# Patient Record
Sex: Female | Born: 1970 | Race: White | Hispanic: No | Marital: Married | Smoking: Never smoker
Health system: Southern US, Community
[De-identification: ages and names within clinical notes are randomized; demographics above are authoritative.]

## PROBLEM LIST (undated history)

## (undated) DIAGNOSIS — K219 Gastro-esophageal reflux disease without esophagitis: Secondary | ICD-10-CM

## (undated) DIAGNOSIS — F329 Major depressive disorder, single episode, unspecified: Secondary | ICD-10-CM

## (undated) DIAGNOSIS — F32A Depression, unspecified: Secondary | ICD-10-CM

## (undated) HISTORY — PX: OTHER SURGICAL HISTORY: SHX169

## (undated) HISTORY — PX: TONSILLECTOMY: SUR1361

## (undated) HISTORY — DX: Major depressive disorder, single episode, unspecified: F32.9

## (undated) HISTORY — DX: Depression, unspecified: F32.A

## (undated) HISTORY — DX: Gastro-esophageal reflux disease without esophagitis: K21.9

---

## 2004-06-11 ENCOUNTER — Emergency Department (HOSPITAL_COMMUNITY): Admission: EM | Admit: 2004-06-11 | Discharge: 2004-06-11 | Payer: Self-pay | Admitting: Emergency Medicine

## 2005-03-20 ENCOUNTER — Other Ambulatory Visit: Admission: RE | Admit: 2005-03-20 | Discharge: 2005-03-20 | Payer: Self-pay | Admitting: Obstetrics and Gynecology

## 2005-04-06 ENCOUNTER — Ambulatory Visit (HOSPITAL_COMMUNITY): Admission: RE | Admit: 2005-04-06 | Discharge: 2005-04-06 | Payer: Self-pay | Admitting: Obstetrics and Gynecology

## 2006-04-28 ENCOUNTER — Ambulatory Visit: Payer: Self-pay | Admitting: Family Medicine

## 2006-06-29 ENCOUNTER — Ambulatory Visit: Payer: Self-pay | Admitting: Family Medicine

## 2007-03-31 ENCOUNTER — Encounter: Admission: RE | Admit: 2007-03-31 | Discharge: 2007-03-31 | Payer: Self-pay | Admitting: Orthopaedic Surgery

## 2007-05-16 ENCOUNTER — Encounter: Payer: Self-pay | Admitting: Family Medicine

## 2008-04-21 ENCOUNTER — Emergency Department (HOSPITAL_COMMUNITY): Admission: EM | Admit: 2008-04-21 | Discharge: 2008-04-22 | Payer: Self-pay | Admitting: Emergency Medicine

## 2008-06-13 ENCOUNTER — Encounter (INDEPENDENT_AMBULATORY_CARE_PROVIDER_SITE_OTHER): Payer: Self-pay | Admitting: Obstetrics and Gynecology

## 2008-06-13 ENCOUNTER — Ambulatory Visit (HOSPITAL_COMMUNITY): Admission: RE | Admit: 2008-06-13 | Discharge: 2008-06-13 | Payer: Self-pay | Admitting: Obstetrics and Gynecology

## 2009-11-01 ENCOUNTER — Ambulatory Visit (HOSPITAL_COMMUNITY): Admission: RE | Admit: 2009-11-01 | Discharge: 2009-11-01 | Payer: Self-pay | Admitting: Obstetrics & Gynecology

## 2009-11-01 IMAGING — US US OB DETAIL+14 WK
2 of 3 series · 14 of 28 positions shown · non-contrast
Comparison: none

OBSTETRICAL ULTRASOUND:
 This ultrasound exam was performed in the [HOSPITAL] Ultrasound Department.  The OB US report was generated in the AS system, and faxed to the ordering physician.  This report is also available in [HOSPITAL]?s AccessANYware and in [REDACTED] PACS.

[Series 1: us ob detail add'left gest + 14 wk · 0.17mm/px · 2 of 29 slices shown (1 of 2)]
[im 1/29]
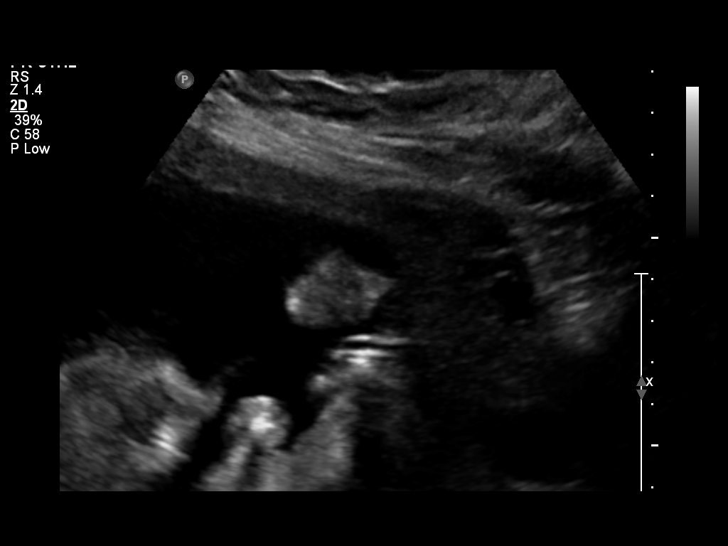
[im 19/29]
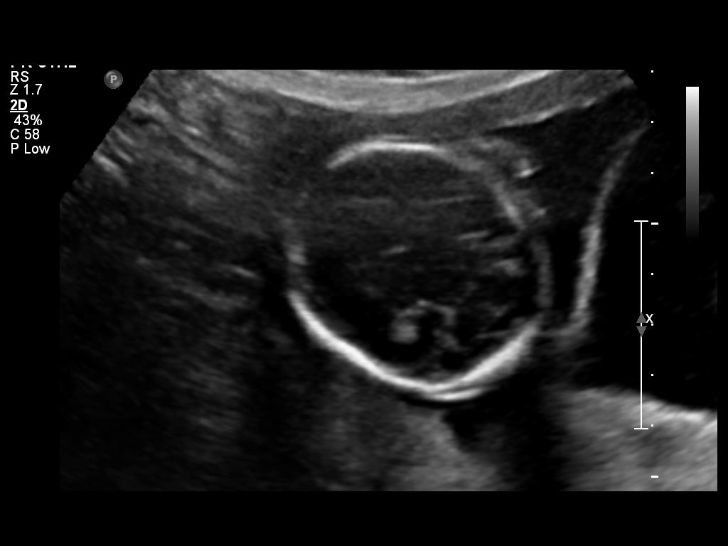

[Series 1: us ob detail add'left gest + 14 wk · 0.22mm/px · 12 of 152 slices shown (2 of 2)]
[im 1/152]
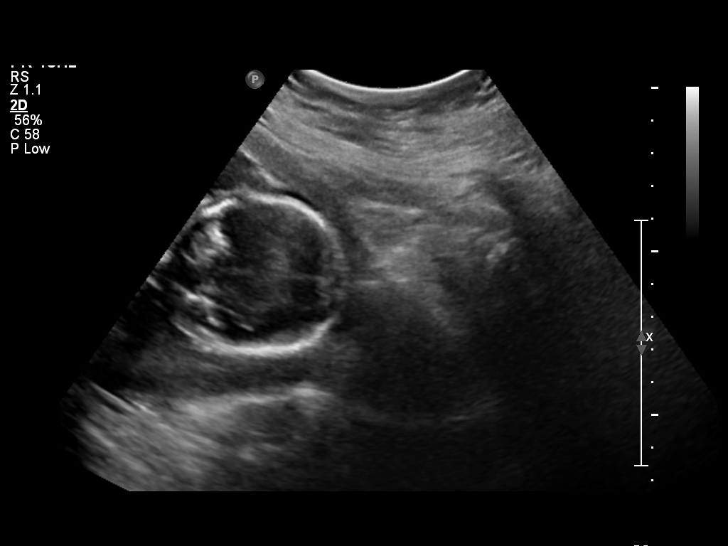
[im 14/152]
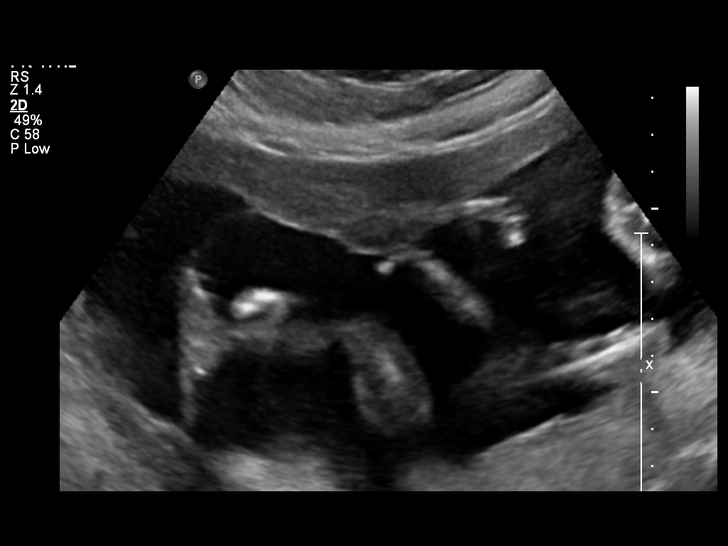
[im 28/152]
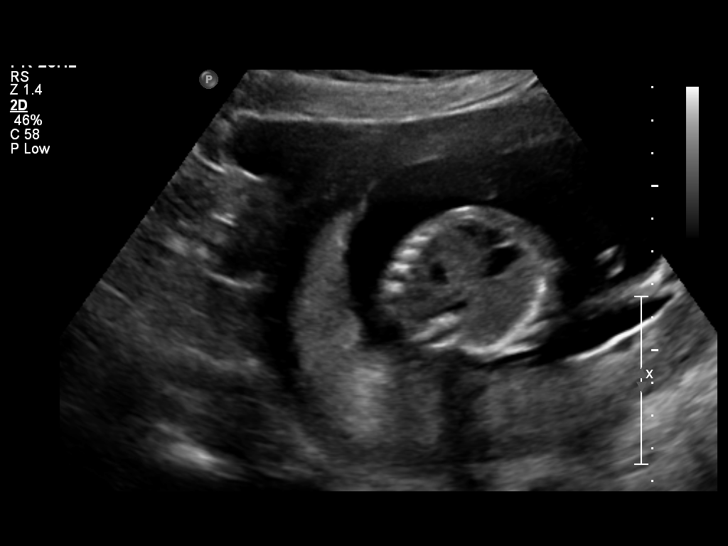
[im 42/152]
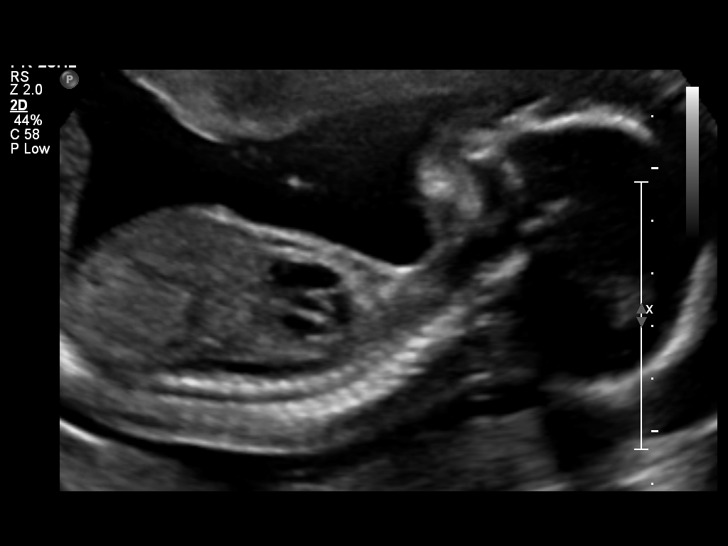
[im 55/152]
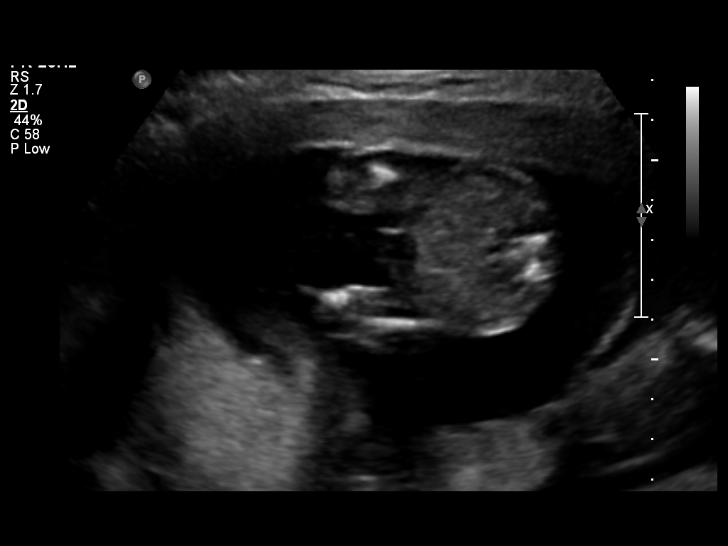
[im 69/152]
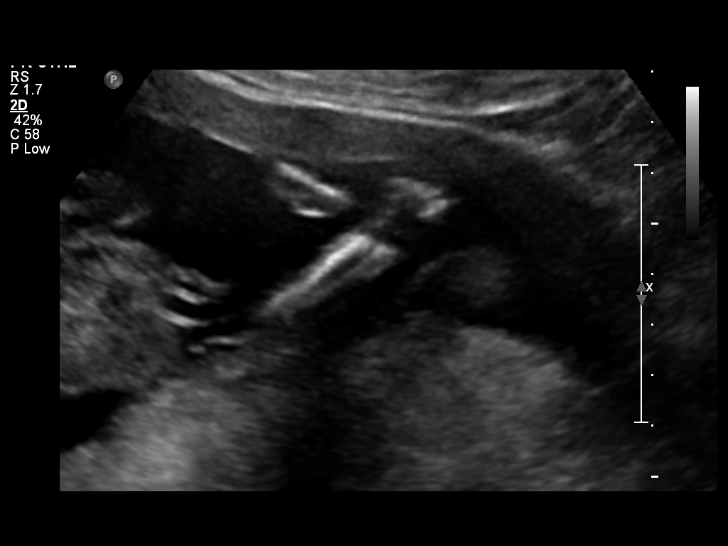
[im 83/152]
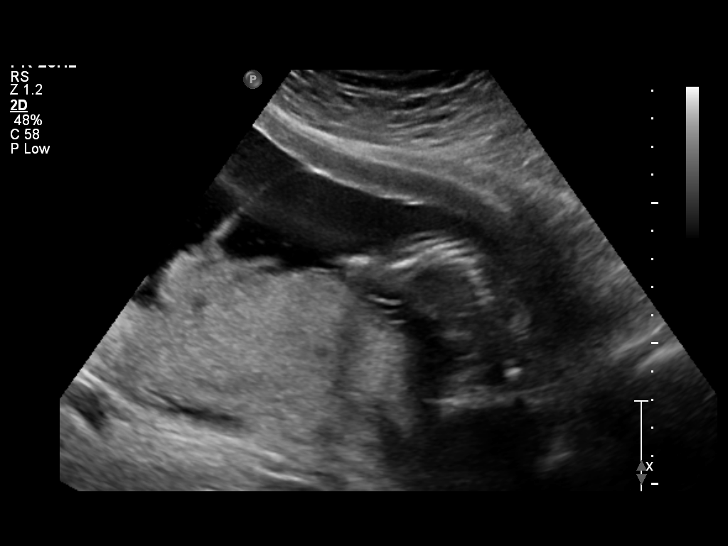
[im 97/152]
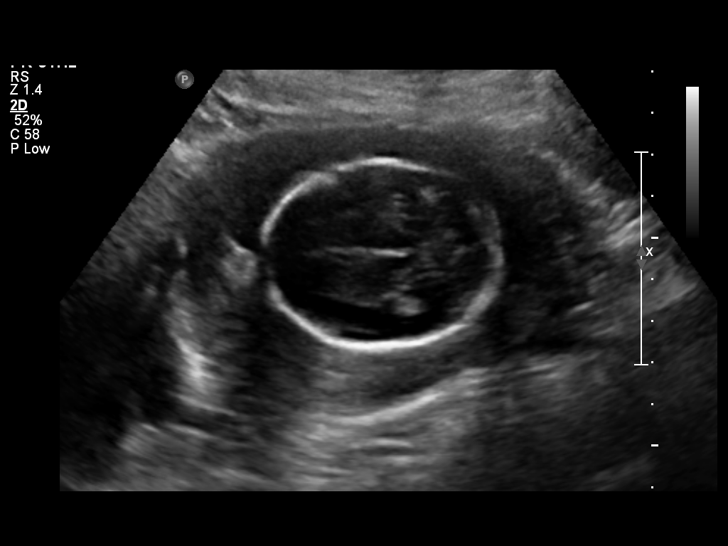
[im 110/152]
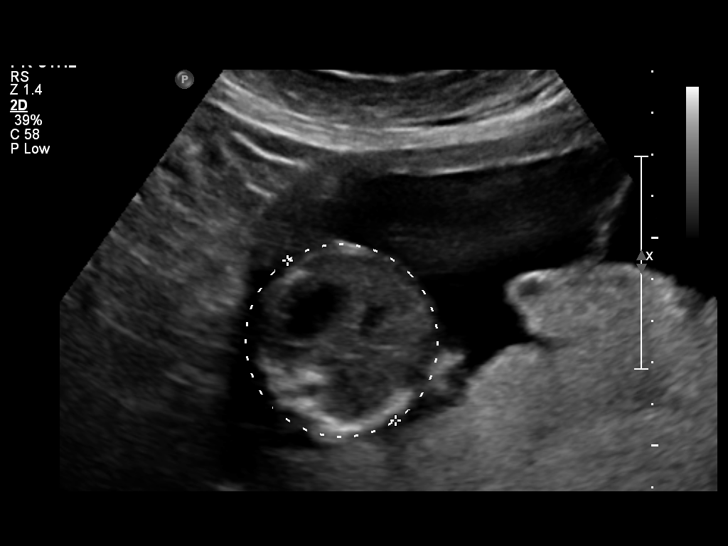
[im 124/152]
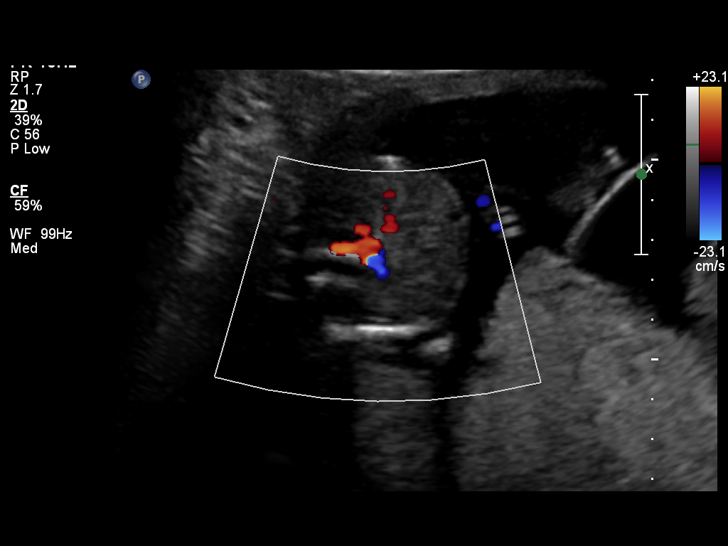
[im 138/152]
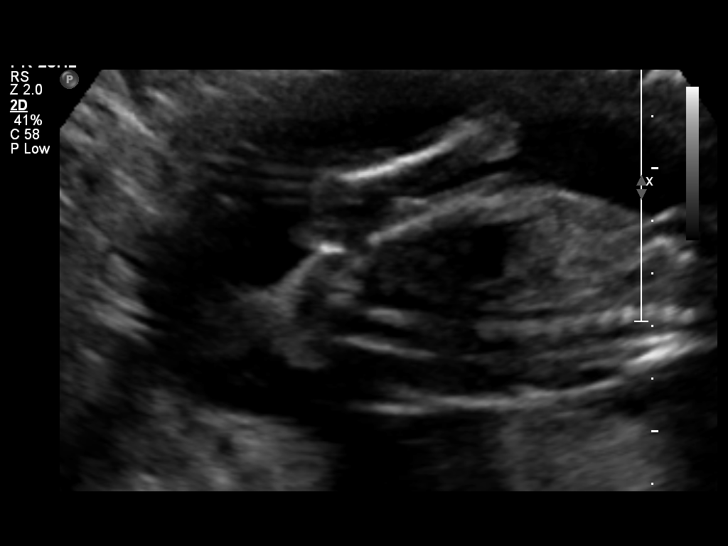
[im 152/152]
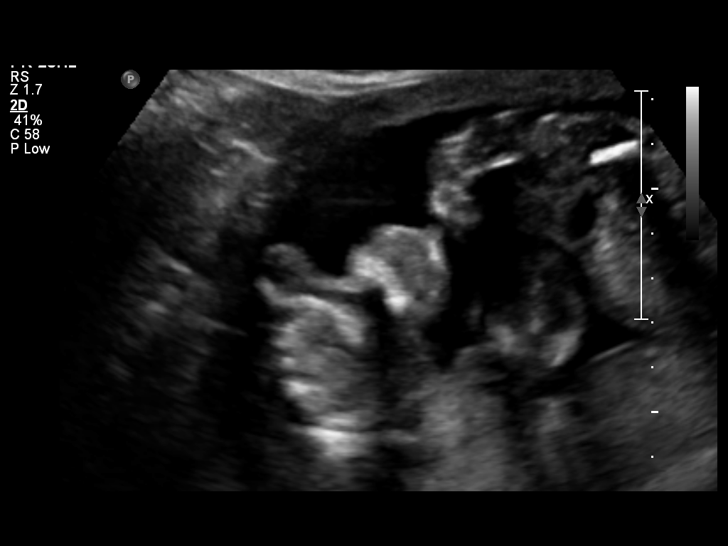

[14 of 28 positions shown; findings below may reference images not displayed]

IMPRESSION: See AS Obstetric US report.

## 2010-01-13 ENCOUNTER — Ambulatory Visit (HOSPITAL_COMMUNITY): Admission: RE | Admit: 2010-01-13 | Discharge: 2010-01-13 | Payer: Self-pay | Admitting: Obstetrics and Gynecology

## 2010-01-15 ENCOUNTER — Inpatient Hospital Stay (HOSPITAL_COMMUNITY): Admission: AD | Admit: 2010-01-15 | Discharge: 2010-01-18 | Payer: Self-pay | Admitting: Obstetrics and Gynecology

## 2010-01-21 ENCOUNTER — Inpatient Hospital Stay (HOSPITAL_COMMUNITY): Admission: AD | Admit: 2010-01-21 | Discharge: 2010-01-23 | Payer: Self-pay | Admitting: Obstetrics and Gynecology

## 2010-01-22 ENCOUNTER — Encounter (INDEPENDENT_AMBULATORY_CARE_PROVIDER_SITE_OTHER): Payer: Self-pay | Admitting: Obstetrics and Gynecology

## 2010-02-04 ENCOUNTER — Inpatient Hospital Stay (HOSPITAL_COMMUNITY): Admission: AD | Admit: 2010-02-04 | Discharge: 2010-02-04 | Payer: Self-pay | Admitting: Obstetrics and Gynecology

## 2010-02-04 ENCOUNTER — Ambulatory Visit: Payer: Self-pay | Admitting: Obstetrics and Gynecology

## 2010-02-06 ENCOUNTER — Inpatient Hospital Stay (HOSPITAL_COMMUNITY): Admission: AD | Admit: 2010-02-06 | Discharge: 2010-02-07 | Payer: Self-pay | Admitting: Obstetrics and Gynecology

## 2010-02-15 ENCOUNTER — Ambulatory Visit: Payer: Self-pay | Admitting: Family

## 2010-02-15 ENCOUNTER — Inpatient Hospital Stay (HOSPITAL_COMMUNITY): Admission: AD | Admit: 2010-02-15 | Discharge: 2010-02-15 | Payer: Self-pay | Admitting: Obstetrics and Gynecology

## 2010-03-02 ENCOUNTER — Encounter (INDEPENDENT_AMBULATORY_CARE_PROVIDER_SITE_OTHER): Payer: Self-pay | Admitting: Obstetrics and Gynecology

## 2010-03-02 ENCOUNTER — Ambulatory Visit: Payer: Self-pay | Admitting: Obstetrics and Gynecology

## 2010-03-02 ENCOUNTER — Inpatient Hospital Stay (HOSPITAL_COMMUNITY): Admission: AD | Admit: 2010-03-02 | Discharge: 2010-03-04 | Payer: Self-pay | Admitting: Obstetrics and Gynecology

## 2010-08-05 ENCOUNTER — Ambulatory Visit: Payer: Self-pay | Admitting: Family Medicine

## 2010-08-05 DIAGNOSIS — F341 Dysthymic disorder: Secondary | ICD-10-CM

## 2010-08-05 DIAGNOSIS — F410 Panic disorder [episodic paroxysmal anxiety] without agoraphobia: Secondary | ICD-10-CM

## 2010-09-08 ENCOUNTER — Telehealth: Payer: Self-pay | Admitting: Family Medicine

## 2010-10-12 ENCOUNTER — Encounter: Payer: Self-pay | Admitting: Obstetrics & Gynecology

## 2010-10-20 ENCOUNTER — Telehealth (INDEPENDENT_AMBULATORY_CARE_PROVIDER_SITE_OTHER): Payer: Self-pay | Admitting: *Deleted

## 2010-10-21 NOTE — Assessment & Plan Note (Signed)
Summary: re-estab; talk about refills for anxiety, depression med from...   Vital Signs:  Patient profile:   40 year old female Height:      63 inches Weight:      210.4 pounds BMI:     37.41 Pulse rate:   88 / minute Pulse rhythm:   regular BP sitting:   112 / 70  (left arm) Cuff size:   regular  Vitals Entered By: Almeta Monas CMA Duncan Dull) (August 05, 2010 2:02 PM) CC: Re-est care--wants to discuss Depression and meds   History of Present Illness: Pt is here to re establish her and to discuss her meds for her depression and anxiety.    Pt had twins 5 months and had post partum depression that was difficult to control   Pt was seeing psych at [facility] but she left and pt was put with someone else and they didn't "click".    Pt has not had xanax since giving  birth to twins.  Her last appointment with them was September.   She is not breast feeding.      Preventive Screening-Counseling & Management  Alcohol-Tobacco     Alcohol drinks/day: 0     Smoking Status: never  Caffeine-Diet-Exercise     Does Patient Exercise: yes      Drug Use:  no.    Current Medications (verified): 1)  Sertraline Hcl 100 Mg Tabs (Sertraline Hcl) .... 2 By Mouth Once Daily 2)  Nortriptyline Hcl 25 Mg Caps (Nortriptyline Hcl) .Marland Kitchen.. 1 By Mouth Once Daily 3)  Alprazolam 1 Mg Tabs (Alprazolam) .Marland Kitchen.. 1 By Mouth Two Times A Day  Allergies (verified): 1)  ! Pcn  Past History:  Family History: Last updated: 08/05/2010 Family History Depression Sister--Bipolar  Social History: Last updated: 08/05/2010 Occupation:  [employer] Married Alcohol use-no Drug use-no  Risk Factors: Alcohol Use: 0 (08/05/2010) Exercise: yes (08/05/2010)  Risk Factors: Smoking Status: never (08/05/2010)  Family History: Reviewed history and no changes required. Family History Depression Sister--Bipolar  Social History: Reviewed history and no changes required. Occupation:  [employer] Married Alcohol use-no Drug use-no Does Patient Exercise:  yes Smoking Status:  never Occupation:  employed Drug Use:  no  Review of Systems      See HPI  Physical Exam  General:  Well-developed,well-nourished,in no acute distress; alert,appropriate and cooperative throughout examination Lungs:  Normal respiratory effort, chest expands symmetrically. Lungs are clear to auscultation, no crackles or wheezes. Heart:  normal rate and no murmur.   Psych:  Cognition and judgment appear intact. Alert and cooperative with normal attention span and concentration. No apparent delusions, illusions, hallucinations   Impression & Recommendations:  Problem # 1:  DEPRESSION/ANXIETY (ICD-300.4) con't zoloft and nortriptyline add xanax back  pt given names and number for psych  Problem # 2:  PANIC ATTACK (ICD-300.01)  Her updated medication list for this problem includes:    Sertraline Hcl 100 Mg Tabs (Sertraline hcl) .Marland Kitchen... 2 by mouth once daily    Nortriptyline Hcl 25 Mg Caps (Nortriptyline hcl) .Marland Kitchen... 1 by mouth once daily    Alprazolam 1 Mg Tabs (Alprazolam) .Marland Kitchen... 1 by mouth two times a day  Discussed medication use and relaxation techniques.   Complete Medication List: 1)  Sertraline Hcl 100 Mg Tabs (Sertraline hcl) .... 2 by mouth once daily 2)  Nortriptyline Hcl 25 Mg Caps (Nortriptyline hcl) .Marland Kitchen.. 1 by mouth once daily 3)  Alprazolam 1 Mg Tabs (Alprazolam) .Marland Kitchen.. 1 by  mouth two times a day  Patient Instructions: 1)  Please schedule a follow-up appointment in 1 month---if not able to get into psych----otherwise as needed  Prescriptions: NORTRIPTYLINE HCL 25 MG CAPS (NORTRIPTYLINE HCL) 1 by mouth once daily  #30 x 5   Entered and Authorized by:   Loreen Freud DO   Signed by:   Loreen Freud DO on 08/05/2010   Method used:   [facility]        [facility] [phone]* (retail)       [address]       [place], [place]  [address]       Ph:  [phone]       Fax: [phone]   RxID:   [id number] SERTRALINE HCL 100 MG TABS (SERTRALINE HCL) 2 by mouth once daily  #60 x 5   Entered and Authorized by:   Loreen Freud DO   Signed by:   Loreen Freud DO on 08/05/2010   Method used:   [facility]ically to        [facility] [phone]* (retail)       [address]       [place], [place]  [address]       Ph: [phone]       Fax: [phone]   RxID:   [id number] ALPRAZOLAM 1 MG TABS (ALPRAZOLAM) 1 by mouth two times a day  #60 x 0   Entered and Authorized by:   Loreen Freud DO   Signed by:   Loreen Freud DO on 08/05/2010   Method used:   Print then Give to Patient   RxID:   [id number]    Orders Added: 1)  New Patient Level III [20254]   Immunization History:  Tetanus/Td Immunization History:    Tetanus/Td:  historical (adacel) (06/29/2006)   Immunization History:  Tetanus/Td Immunization History:    Tetanus/Td:  historical (Adacel) (06/29/2006)

## 2010-10-22 ENCOUNTER — Emergency Department (HOSPITAL_BASED_OUTPATIENT_CLINIC_OR_DEPARTMENT_OTHER)
Admission: EM | Admit: 2010-10-22 | Discharge: 2010-10-22 | Disposition: A | Discharge status: Home / Self Care | Payer: Managed Care, Other (non HMO) | Attending: Emergency Medicine | Admitting: Emergency Medicine

## 2010-10-22 ENCOUNTER — Emergency Department (INDEPENDENT_AMBULATORY_CARE_PROVIDER_SITE_OTHER): Payer: Managed Care, Other (non HMO)

## 2010-10-22 DIAGNOSIS — F329 Major depressive disorder, single episode, unspecified: Secondary | ICD-10-CM | POA: Insufficient documentation

## 2010-10-22 DIAGNOSIS — M6580 Other synovitis and tenosynovitis, unspecified site: Secondary | ICD-10-CM | POA: Insufficient documentation

## 2010-10-22 DIAGNOSIS — M25529 Pain in unspecified elbow: Secondary | ICD-10-CM | POA: Insufficient documentation

## 2010-10-22 DIAGNOSIS — F3289 Other specified depressive episodes: Secondary | ICD-10-CM | POA: Insufficient documentation

## 2010-10-23 NOTE — Progress Notes (Signed)
Summary: Refill Request  Phone Note Refill Request Call back at [phone] Message from:  Pharmacy on September 08, 2010 9:46 AM  Refills Requested: Medication #1:  ALPRAZOLAM 1 MG TABS 1 by mouth two times a day.   Dosage confirmed as above?Dosage Confirmed   Supply Requested: 1 month   [facility]  Next Appointment Scheduled: none Initial call taken by: Harold Barban,  September 08, 2010 9:46 AM  Follow-up for Phone Call        last seen and filled 08/05/10. please advise Follow-up by: Almeta Monas CMA Duncan Dull),  September 08, 2010 11:31 AM  Additional Follow-up for Phone Call Additional follow up Details #1::        refill x1 Additional Follow-up by: Loreen Freud DO,  September 08, 2010 11:40 AM    Prescriptions: ALPRAZOLAM 1 MG TABS (ALPRAZOLAM) 1 by mouth two times a day  #60 x 0   Entered by:   Almeta Monas CMA (AAMA)   Authorized by:   Loreen Freud DO   Signed by:   Almeta Monas CMA (AAMA) on 09/08/2010   Method used:   Printed then faxed to ...       [facility] [phone]* (retail)       [address]       [place], [place]  [address]       Ph: [phone]       Fax: [phone]   RxID:   [id number]

## 2010-10-29 NOTE — Progress Notes (Signed)
Summary: Refill Request  Phone Note Refill Request Call back at [phone] Message from:  Pharmacy on October 20, 2010 3:28 PM  Refills Requested: Medication #1:  ALPRAZOLAM 1 MG TABS 1 by mouth two times a day.   Dosage confirmed as above?Dosage Confirmed   Supply Requested: 60   [facility]2010 [facility]  Next Appointment Scheduled: none Initial call taken by: Harold Barban,  October 20, 2010 3:28 PM  Follow-up for Phone Call        last seen 08/05/10 and filled 09/08/10 please advise Follow-up by: Almeta Monas CMA Duncan Dull),  October 20, 2010 3:55 PM  Additional Follow-up for Phone Call Additional follow up Details #1::        refill x1 Additional Follow-up by: Loreen Freud DO,  October 20, 2010 5:01 PM    Prescriptions: ALPRAZOLAM 1 MG TABS (ALPRAZOLAM) 1 by mouth two times a day  #60 x 0   Entered by:   Doristine Devoid CMA   Authorized by:   Loreen Freud DO   Signed by:   Doristine Devoid CMA on 10/21/2010   Method used:   Printed then faxed to ...       [facility] [phone]* (retail)       [address]       [place], [place]  [address]       Ph: [phone]       Fax: [phone]   RxID:   [id number]

## 2010-11-21 ENCOUNTER — Telehealth: Payer: Self-pay | Admitting: Family Medicine

## 2010-11-27 NOTE — Progress Notes (Signed)
Summary: refill  Phone Note Refill Request Message from:  Fax from Pharmacy on November 21, 2010 9:20 AM  Refills Requested: Medication #1:  ALPRAZOLAM 1 MG TABS 1 by mouth two times a day.   Last Refilled: 10/21/2010 [facility] [phone]  Initial call taken by: Okey Regal [place],  November 21, 2010 9:20 AM  Follow-up for Phone Call        last seen 08/05/10 and filled 10/21/10 please advise Follow-up by: Almeta Monas CMA Duncan Dull),  November 21, 2010 9:43 AM  Additional Follow-up for Phone Call Additional follow up Details #1::        refill x1 Additional Follow-up by: Loreen Freud DO,  November 21, 2010 11:47 AM    Prescriptions: ALPRAZOLAM 1 MG TABS (ALPRAZOLAM) 1 by mouth two times a day  #60 x 0   Entered by:   Almeta Monas CMA (AAMA)   Authorized by:   Loreen Freud DO   Signed by:   Almeta Monas CMA (AAMA) on 11/21/2010   Method used:   Printed then faxed to ...       [facility] [phone]* (retail)       [address]       [place], [place]  [address]       Ph: [phone]       Fax: [phone]   RxID:   [id number]

## 2010-12-08 LAB — DIFFERENTIAL
Lymphocytes Relative: 21 % (ref 12–46)
Lymphs Abs: 1.6 10*3/uL (ref 0.7–4.0)
Monocytes Absolute: 0.6 10*3/uL (ref 0.1–1.0)
Monocytes Relative: 8 % (ref 3–12)
Neutrophils Relative %: 69 % (ref 43–77)

## 2010-12-08 LAB — CBC
HCT: 31.9 % — ABNORMAL LOW (ref 36.0–46.0)
Hemoglobin: 11.1 g/dL — ABNORMAL LOW (ref 12.0–15.0)
MCHC: 35 g/dL (ref 30.0–36.0)
MCV: 92.8 fL (ref 78.0–100.0)
MCV: 93.2 fL (ref 78.0–100.0)
Platelets: 156 10*3/uL (ref 150–400)
Platelets: 243 10*3/uL (ref 150–400)
RBC: 3.46 MIL/uL — ABNORMAL LOW (ref 3.87–5.11)
RDW: 14.6 % (ref 11.5–15.5)
RDW: 14.4 % (ref 11.5–15.5)
WBC: 7.5 10*3/uL (ref 4.0–10.5)
WBC: 7.7 10*3/uL (ref 4.0–10.5)

## 2010-12-09 LAB — URINALYSIS, ROUTINE W REFLEX MICROSCOPIC
Hgb urine dipstick: NEGATIVE
Ketones, ur: NEGATIVE mg/dL
Specific Gravity, Urine: 1.025 (ref 1.005–1.030)

## 2010-12-09 LAB — STREP B DNA PROBE

## 2010-12-09 LAB — RPR: RPR Ser Ql: NONREACTIVE

## 2010-12-09 LAB — CREATININE, SERUM: Creatinine, Ser: <0.3 mg/dL — ABNORMAL LOW (ref 0.4–1.2)

## 2010-12-09 LAB — CBC
HCT: 31.4 % — ABNORMAL LOW (ref 36.0–46.0)
MCHC: 35 g/dL (ref 30.0–36.0)
MCV: 93.4 fL (ref 78.0–100.0)
RBC: 3.36 MIL/uL — ABNORMAL LOW (ref 3.87–5.11)
RDW: 13.8 % (ref 11.5–15.5)
WBC: 10 10*3/uL (ref 4.0–10.5)

## 2010-12-09 LAB — FETAL FIBRONECTIN: Fetal Fibronectin: POSITIVE — AB

## 2010-12-09 LAB — CREATININE CLEARANCE, URINE, 24 HOUR
Collection Interval-CRCL: 24 hours
Creatinine Clearance: 361 mL/min — ABNORMAL HIGH (ref 75–115)
Creatinine, 24H Ur: 1562 mg/d (ref 700–1800)
Creatinine: 0.3 mg/dL — ABNORMAL LOW (ref 0.4–1.2)
Urine Total Volume-CRCL: 3875 mL

## 2010-12-22 ENCOUNTER — Other Ambulatory Visit: Payer: Self-pay

## 2010-12-22 MED ORDER — ALPRAZOLAM 1 MG PO TABS: 1.0000 mg | ORAL_TABLET | Freq: Two times a day (BID) | ORAL | Status: DC

## 2010-12-22 NOTE — Telephone Encounter (Signed)
Last seen 11/15/11and filled 11/21/10 please advise     KP

## 2010-12-22 NOTE — Telephone Encounter (Signed)
Rx faxed.    KP 

## 2011-01-20 ENCOUNTER — Other Ambulatory Visit: Payer: Self-pay

## 2011-01-20 MED ORDER — ALPRAZOLAM 1 MG PO TABS: 1.0000 mg | ORAL_TABLET | Freq: Two times a day (BID) | ORAL | Status: DC

## 2011-01-20 NOTE — Telephone Encounter (Signed)
Last seen 08/05/10 and filled 12/22/10 please advise    KP

## 2011-01-20 NOTE — Telephone Encounter (Signed)
Faxed.   KP 

## 2011-02-03 NOTE — Op Note (Signed)
Veronica Yates, Veronica Yates            ACCOUNT NO.:  [id number]   MEDICAL RECORD NO.:  [id number]          PATIENT TYPE:  AMB   LOCATION:  SDC                           FACILITY:  [facility]   PHYSICIAN:  Carrington Clamp, M.D. DATE OF BIRTH:  20-Jan-1971   DATE OF PROCEDURE:  06/13/2008  DATE OF DISCHARGE:                               OPERATIVE REPORT   PREOPERATIVE DIAGNOSES:  1. Menorrhagia.  2. Dysmenorrhea.  3. History of endometriosis and desires fertility.   POSTOPERATIVE DIAGNOSES:  1. Menorrhagia.  2. Dysmenorrhea.  3. History of endometriosis and desires fertility.   PROCEDURE:  Diagnostic laparoscopy with D&C and hysteroscopy.   SURGEON:  Carrington Clamp, MD   ASSISTANTS:  None.   ANESTHESIA:  General.   FINDINGS:  After careful inspection of the entire pelvis, there was only  a small amount of endometriosis within the 5-mm plaque identified on and  another one near the right ureter on the pelvic sidewall below the  pelvic brim.  This is too close to the ureter to be operated on.  There  were no other endometrial plaques noted on the other surfaces except for  perhaps some clear plaques noted on the cecum.  The appendix appeared  normal however.  There were normal ovaries without lesions of apparent  lesions of endometriosis on them.  There were no major cysts.  Broad-  ligament and pelvic sidewall as well as the bladder and the cul-de-sac  were otherwise clear.  There is a small fold of the peritoneum and the  left cul-de-sac underneath the uterosacral however, there was no  apparent endometriosis in this area and it did not clearly look like a  mattress since defect.  There is a small adhesion of the right ovary to  the pelvic sidewall that was interrupted.  The tubes were in normal  appearance and had healthy fimbria.  There were stitches that were  apparent from the tubal ring anastomosis on the left-hand side that the  right-hand side stitches could not be  identified.  Chromopertubation was  foregone as she has had a normal HSG 2 years ago and the tubes were in  completely normal appearance at this point.  The liver edge was normal  as well.   PATHOLOGY:  Endometrial curettings to pathology.   ESTIMATED BLOOD LOSS:  Minimal.   IV FLUIDS:  1500 mL.   URINE OUTPUT:  Not measured.   COMPLICATIONS:  None.   MEDICATIONS:  None.   TECHNIQUE:  After adequate general anesthesia was achieved, the patient  was prepped, draped in usual sterile fashion in dorsal lithotomy  position.  Speculum was placed in the vagina and single-tooth tenaculum  was placed on the cervix.  Kahn cannula was then placed inside the  uterine cervix and the speculum removed.  The bladder was drained with  the red rubber catheter during the procedure.  After gloves were  changed, attention was turned to the abdomen where 2 cm infraumbilical  incision was made with the scalpel.  First, Allis clamp was used to lift  the anterior abdominal wall, and then Kocher on the  fascia was then used  to the lift anterior abdominal wall and the OptiVu scope was entered  into the abdomen with direct visualization.  There are no adhesions of  the bowel to the underside of the peritoneal surface at the umbilicus.   A 5-mm incision was made with the scalpel and 5-mm trocar was placed to  the right of the round ligament insertion on the right-hand side of the  patient.  The probe was then inserted into the abdomen and the above  findings and the careful inspection of all surfaces in the pelvis was  undertaken.  The above findings were noted and since there was no  endometriosis in the place which could be safely operated on.  There was  no operative scope.  Instruments withdrawn from the abdomen.  Abdomen  desufflated, but the trocars were left in place.  Attention was then  turned to the vagina where the Kahn cannula was removed and the cervix  was dilated with Shawnie Pons dilators.   Hysteroscope was passed inside the  endometrial cavity and both ostia were seen clearly without blockage.  A  shaggy endometrium, possible polyp was noted, no other abnormalities.  There may have been a small polyp and a careful and gentle curettage was  performed just to obtain tissue for pathology.  All instruments were  then withdrawn from the vagina and gloves were changed, then the trocars  removed in the usual fashion.  A 1 cm infraumbilical fascial incision  was closed with a through-and-through figure-of-eight stitches of 2-0  Vicryl.  The skin incision suprapubically was closed with a through-and-  through stitch of 3-0 Vicryl Rapide.  The skin incision was closed with  Dermabond.  The patient tolerated the procedure well, and was returned  to recovery room in stable condition.      Carrington Clamp, M.D.  Electronically Signed     MH/MEDQ  D:  06/13/2008  T:  06/14/2008  Job:  [id number]

## 2011-02-09 ENCOUNTER — Other Ambulatory Visit: Payer: Self-pay

## 2011-02-09 NOTE — Telephone Encounter (Signed)
Last seen and filled 08/05/10 with 5 refills   Please advise     KP

## 2011-02-10 MED ORDER — SERTRALINE HCL 100 MG PO TABS: 200.0000 mg | ORAL_TABLET | Freq: Every day | ORAL | Status: DC

## 2011-02-17 ENCOUNTER — Other Ambulatory Visit: Payer: Self-pay | Admitting: Family Medicine

## 2011-02-17 MED ORDER — ALPRAZOLAM 1 MG PO TABS: 1.0000 mg | ORAL_TABLET | Freq: Two times a day (BID) | ORAL | Status: DC

## 2011-02-17 NOTE — Telephone Encounter (Signed)
Ok to refill x 1  

## 2011-02-17 NOTE — Telephone Encounter (Signed)
Last seen 08/05/10 and filled 01/20/11 please advise     KP

## 2011-02-17 NOTE — Telephone Encounter (Signed)
Rx printed and faxed--pt has been made aware    KP

## 2011-02-17 NOTE — Telephone Encounter (Signed)
Spoke with patient and she is going out of town tomorrow and will be gone for a week and wanted to pick up the Rx before she left     KP

## 2011-02-18 ENCOUNTER — Encounter: Payer: Self-pay | Admitting: Family Medicine

## 2011-02-23 ENCOUNTER — Ambulatory Visit (INDEPENDENT_AMBULATORY_CARE_PROVIDER_SITE_OTHER): Payer: Managed Care, Other (non HMO) | Admitting: Family Medicine

## 2011-02-23 ENCOUNTER — Encounter: Payer: Self-pay | Admitting: Family Medicine

## 2011-02-23 VITALS — BP 114/68 | HR 99 | Temp 99.1°F | Wt 219.4 lb

## 2011-02-23 DIAGNOSIS — E669 Obesity, unspecified: Secondary | ICD-10-CM

## 2011-02-23 DIAGNOSIS — Z136 Encounter for screening for cardiovascular disorders: Secondary | ICD-10-CM

## 2011-02-23 DIAGNOSIS — F341 Dysthymic disorder: Secondary | ICD-10-CM

## 2011-02-23 DIAGNOSIS — F329 Major depressive disorder, single episode, unspecified: Secondary | ICD-10-CM

## 2011-02-23 MED ORDER — PHENTERMINE HCL 37.5 MG PO CAPS: 37.5000 mg | ORAL_CAPSULE | ORAL | Status: DC

## 2011-02-23 MED ORDER — NORTRIPTYLINE HCL 25 MG PO CAPS: 25.0000 mg | ORAL_CAPSULE | Freq: Every day | ORAL | Status: DC

## 2011-02-23 NOTE — Assessment & Plan Note (Signed)
con't meds Doing well 

## 2011-02-23 NOTE — Assessment & Plan Note (Addendum)
D/w diet and exercise--she will try [facility] again adipex 37.5  1 po qd  rto 1 months or sooner prn EKG--reviewed

## 2011-02-23 NOTE — Progress Notes (Signed)
  Subjective:    Patient ID: Veronica Yates, female    DOB: [birth year], 40 y.o.   MRN: [id number]  HPI  Pt here f/u depression--she is doing well emotionally.  She is struggling with weight loss and would like to discuss phenteramine.  Pt is exercising as much as she can with back pain etc.  No other complaints.  Review of Systems  All other systems reviewed and are negative.       Objective:   Physical Exam  Constitutional: She is oriented to person, place, and time. She appears well-developed and well-nourished.  Cardiovascular: Normal rate, regular rhythm and normal heart sounds.   No murmur heard. Pulmonary/Chest: Effort normal and breath sounds normal. No respiratory distress. She has no wheezes. She has no rales. She exhibits no tenderness.  Musculoskeletal: She exhibits no edema and no tenderness.  Neurological: She is alert and oriented to person, place, and time. She has normal reflexes.  Psychiatric: She has a normal mood and affect. Her behavior is normal. Judgment and thought content normal.          Assessment & Plan:

## 2011-02-23 NOTE — Patient Instructions (Signed)
Calorie Counting Diet A calorie counting diet requires you to eat the number of calories that are right for you during a day. Calories are the measurement of how much energy you get from the food you eat. Eating the right amount of calories is important for staying at a healthy weight. If you eat too many calories your body will store them as fat and you may gain weight. If you eat too few calories you may lose weight. Counting the number of calories that you eat during a day will help you to know if you're eating the right amount. A Registered Dietitian can determine how many calories you need in a day. The amount of calories you need varies from person to person. If your goal is to lose weight you will need to eat fewer calories. Losing weight can benefit you if you are overweight or have health problems such as heart disease, high blood pressure or diabetes. If your goal is to gain weight, you will need to eat more calories. Gaining weight may be necessary if you have a certain health problem that causes your body to need more energy. TIPS Whether you are increasing or decreasing the number of calories you eat during a day, it may be hard to get used to changing what you eat and drink. The following are tips to help you keep track of the number of calories you are eating.  Measuring foods at home with measuring cups will help you to know the actual amount of food and number of calories you are eating.   Restaurants serve food in all different portion sizes. It is common that restaurants will serve food in amounts worth 2 or more serving sizes. While eating out, it may be helpful to estimate how many servings of a food you are given. For example, a serving of cooked rice is 1/2 cup and that is the size of half of a fist. Knowing serving sizes will help you have a better idea of how much food you are eating at restaurants.   Ask for smaller portion sizes or child-size portions at restaurants.   Plan to  eat half of a meal at a restaurant and take the rest home or share the other half with a friend   Read food labels for calorie content and serving size   Most packaged food has a Nutrition Facts Panel on its side or back. Here you can find out how many servings are in a package, the size of a serving, and the number of calories each serving has.   The serving size and number of servings per container are listed right below the Nutrition Facts heading. Just below the serving information, the number of calories in each serving is listed.   For example, say that a package has three cookies inside. The Nutrition Facts panel says that one serving is one cookie. Below that, it says that there are three servings in the container. The calories section of the Nutrition Facts says there are 90 calories. That means that there are 90 calories in one cookie. If you eat one cookie you have eaten 90 calories. If you eat all three cookies, you have eaten three times that amount, or 270 calories.  The list below tells you how big or small some common portion sizes are.  1 ounce (oz).................4 stacked dice.   3 oz..............................Deck of cards.   1 teaspoon (tsp)...........Tip of little finger.   1 tablespoon (Tbsp)....Tip of thumb.     2 Tbsp..........................Golf ball.    Cup..........................Half of a fist.   1 Cup...........................A fist.  KEEP A FOOD LOG Write down every food item that you eat, how much of the food you eat, and the number of calories in each food that you eat during the day. At the end of the day or throughout the day you can add up the total number of calories you have eaten.  It may help to set up a list like the one below. Find out the calorie information by reading food labels.  Breakfast   Bran Flakes (1 cup, 110 calories).   Fat free milk ( cup, 45 calories).   Snack   Apple (1 medium, 80 calories).   Lunch   Spinach (1  cup, 20 calories).   Tomato ( medium, 20 calories).   Chicken breast strips (3 oz, 165 calories).   Shredded cheddar cheese ( cup, 110 calories).   Light Italian dressing (2 Tbsp, 60 calories).   Whole wheat bread (1 slice, 80 calories).   Tub margarine (1 tsp, 35 calories).   Vegetable soup (1 cup, 160 calories).   Dinner   Pork chop (3 oz, 190 calories).   Brown rice (1 cup, 215 calories).   Steamed broccoli ( cup, 20 calories).   Strawberries (1  cup, 65 calories).   Whipped cream (1 Tbsp, 50 calories).  Daily Calorie Total: 1425 Information from www.eatright.org, Foodwise Nutritional Analysis Database. Document Released: 09/07/2005 Document Re-Released: 09/29/2009 Exit[employer] Patient Information 2011 [employer]. 

## 2011-03-04 ENCOUNTER — Other Ambulatory Visit: Payer: Self-pay | Admitting: Family Medicine

## 2011-03-04 NOTE — Telephone Encounter (Signed)
Rx was sent with 11 refills--I spoke with pharmacy an they said they would delete the old RX     KP

## 2011-03-23 ENCOUNTER — Other Ambulatory Visit: Payer: Self-pay

## 2011-03-23 DIAGNOSIS — E669 Obesity, unspecified: Secondary | ICD-10-CM

## 2011-03-23 MED ORDER — ALPRAZOLAM 1 MG PO TABS: 1.0000 mg | ORAL_TABLET | Freq: Two times a day (BID) | ORAL | Status: DC

## 2011-03-23 NOTE — Telephone Encounter (Signed)
Last seen 02/23/11 ---- Adipex filled 02/23/11 and Alprazolam filled 02/17/11 appt scheduled for 03/26/11  Please advise    KP

## 2011-03-23 NOTE — Telephone Encounter (Signed)
Faxed Alprazolam only---Adipex declined by Dr.Lowne    KP

## 2011-03-26 ENCOUNTER — Ambulatory Visit (INDEPENDENT_AMBULATORY_CARE_PROVIDER_SITE_OTHER): Payer: Managed Care, Other (non HMO) | Admitting: Family Medicine

## 2011-03-26 ENCOUNTER — Encounter: Payer: Self-pay | Admitting: Family Medicine

## 2011-03-26 ENCOUNTER — Telehealth: Payer: Self-pay | Admitting: Family Medicine

## 2011-03-26 VITALS — BP 110/80 | HR 83 | Temp 97.7°F | Wt 210.6 lb

## 2011-03-26 DIAGNOSIS — E669 Obesity, unspecified: Secondary | ICD-10-CM

## 2011-03-26 MED ORDER — PHENTERMINE HCL 37.5 MG PO CAPS: 37.5000 mg | ORAL_CAPSULE | ORAL | Status: DC

## 2011-03-26 NOTE — Progress Notes (Signed)
  Subjective:    Patient ID: Veronica Yates, female    DOB: May 03, 1971, 40 y.o.   MRN: [id number]  HPI  Pt here f/u adipex.  Pt doing well with meds , diet and exercise.  She is following [employer] and swimming 2x a week.  She is working on increasing that to 3x. Review of Systems   as above Objective:   Physical Exam  Constitutional: She is oriented to person, place, and time. She appears well-developed and well-nourished.  Cardiovascular: Normal rate, regular rhythm and normal heart sounds.   No murmur heard. Pulmonary/Chest: Effort normal and breath sounds normal. No respiratory distress. She has no wheezes. She has no rales. She exhibits no tenderness.  Musculoskeletal: Normal range of motion. She exhibits no edema and no tenderness.  Neurological: She is alert and oriented to person, place, and time.  Psychiatric: She has a normal mood and affect. Her behavior is normal. Thought content normal.          Assessment & Plan:

## 2011-03-26 NOTE — Patient Instructions (Signed)
Calorie Counting Diet A calorie counting diet requires you to eat the number of calories that are right for you during a day. Calories are the measurement of how much energy you get from the food you eat. Eating the right amount of calories is important for staying at a healthy weight. If you eat too many calories your body will store them as fat and you may gain weight. If you eat too few calories you may lose weight. Counting the number of calories that you eat during a day will help you to know if you're eating the right amount. A Registered Dietitian can determine how many calories you need in a day. The amount of calories you need varies from person to person. If your goal is to lose weight you will need to eat fewer calories. Losing weight can benefit you if you are overweight or have health problems such as heart disease, high blood pressure or diabetes. If your goal is to gain weight, you will need to eat more calories. Gaining weight may be necessary if you have a certain health problem that causes your body to need more energy. TIPS Whether you are increasing or decreasing the number of calories you eat during a day, it may be hard to get used to changing what you eat and drink. The following are tips to help you keep track of the number of calories you are eating.  Measuring foods at home with measuring cups will help you to know the actual amount of food and number of calories you are eating.   Restaurants serve food in all different portion sizes. It is common that restaurants will serve food in amounts worth 2 or more serving sizes. While eating out, it may be helpful to estimate how many servings of a food you are given. For example, a serving of cooked rice is 1/2 cup and that is the size of half of a fist. Knowing serving sizes will help you have a better idea of how much food you are eating at restaurants.   Ask for smaller portion sizes or child-size portions at restaurants.   Plan to  eat half of a meal at a restaurant and take the rest home or share the other half with a friend   Read food labels for calorie content and serving size   Most packaged food has a Nutrition Facts Panel on its side or back. Here you can find out how many servings are in a package, the size of a serving, and the number of calories each serving has.   The serving size and number of servings per container are listed right below the Nutrition Facts heading. Just below the serving information, the number of calories in each serving is listed.   For example, say that a package has three cookies inside. The Nutrition Facts panel says that one serving is one cookie. Below that, it says that there are three servings in the container. The calories section of the Nutrition Facts says there are 90 calories. That means that there are 90 calories in one cookie. If you eat one cookie you have eaten 90 calories. If you eat all three cookies, you have eaten three times that amount, or 270 calories.  The list below tells you how big or small some common portion sizes are.  1 ounce (oz).................4 stacked dice.   3 oz..............................Deck of cards.   1 teaspoon (tsp)...........Tip of little finger.   1 tablespoon (Tbsp)....Tip of thumb.     2 Tbsp..........................Golf ball.    Cup..........................Half of a fist.   1 Cup...........................A fist.  KEEP A FOOD LOG Write down every food item that you eat, how much of the food you eat, and the number of calories in each food that you eat during the day. At the end of the day or throughout the day you can add up the total number of calories you have eaten.  It may help to set up a list like the one below. Find out the calorie information by reading food labels.  Breakfast   Bran Flakes (1 cup, 110 calories).   Fat free milk ( cup, 45 calories).   Snack   Apple (1 medium, 80 calories).   Lunch   Spinach (1  cup, 20 calories).   Tomato ( medium, 20 calories).   Chicken breast strips (3 oz, 165 calories).   Shredded cheddar cheese ( cup, 110 calories).   Light Italian dressing (2 Tbsp, 60 calories).   Whole wheat bread (1 slice, 80 calories).   Tub margarine (1 tsp, 35 calories).   Vegetable soup (1 cup, 160 calories).   Dinner   Pork chop (3 oz, 190 calories).   Brown rice (1 cup, 215 calories).   Steamed broccoli ( cup, 20 calories).   Strawberries (1  cup, 65 calories).   Whipped cream (1 Tbsp, 50 calories).  Daily Calorie Total: 1425 Information from www.eatright.org, Foodwise Nutritional Analysis Database. Document Released: 09/07/2005 Document Re-Released: 09/29/2009 Exit[employer] Patient Information 2011 [employer]. 

## 2011-03-26 NOTE — Telephone Encounter (Signed)
Will fill at appt today    KP

## 2011-03-26 NOTE — Assessment & Plan Note (Signed)
Con't diet and exercise rto 1 month Refill adipex

## 2011-04-24 ENCOUNTER — Other Ambulatory Visit: Payer: Self-pay | Admitting: Family Medicine

## 2011-04-24 MED ORDER — ALPRAZOLAM 1 MG PO TABS: 1.0000 mg | ORAL_TABLET | Freq: Two times a day (BID) | ORAL | Status: DC

## 2011-04-24 NOTE — Telephone Encounter (Signed)
Last seen 03/26/11 and filled 03/23/11  # 60   KP

## 2011-04-27 ENCOUNTER — Ambulatory Visit: Payer: Managed Care, Other (non HMO) | Admitting: Family Medicine

## 2011-05-01 ENCOUNTER — Ambulatory Visit: Payer: Managed Care, Other (non HMO) | Admitting: Family Medicine

## 2011-05-08 ENCOUNTER — Ambulatory Visit (INDEPENDENT_AMBULATORY_CARE_PROVIDER_SITE_OTHER): Payer: Managed Care, Other (non HMO) | Admitting: Family Medicine

## 2011-05-08 ENCOUNTER — Encounter: Payer: Self-pay | Admitting: Family Medicine

## 2011-05-08 DIAGNOSIS — R232 Flushing: Secondary | ICD-10-CM

## 2011-05-08 DIAGNOSIS — E669 Obesity, unspecified: Secondary | ICD-10-CM

## 2011-05-08 DIAGNOSIS — L659 Nonscarring hair loss, unspecified: Secondary | ICD-10-CM

## 2011-05-08 DIAGNOSIS — N951 Menopausal and female climacteric states: Secondary | ICD-10-CM

## 2011-05-08 LAB — T4, FREE: Free T4: 1.08 ng/dL (ref 0.80–1.80)

## 2011-05-08 MED ORDER — PHENTERMINE HCL 37.5 MG PO CAPS: 37.5000 mg | ORAL_CAPSULE | ORAL | Status: DC

## 2011-05-08 NOTE — Progress Notes (Signed)
  Subjective:    Patient ID: Veronica Yates, female    DOB: 1970-11-14, 40 y.o.   MRN: [id number]  HPI Pt here c/o dry, brittle hair , skin and f/u weight loss.  Pt doing well with diet and exercise.  No other complaints.     Review of Systems    as above Objective:   Physical Exam  Constitutional: She is oriented to person, place, and time. She appears well-developed and well-nourished.  Neck: Normal range of motion. Neck supple.  Cardiovascular: Normal rate, regular rhythm and normal heart sounds.   Pulmonary/Chest: Effort normal and breath sounds normal. No respiratory distress. She has no wheezes. She has no rales. She exhibits no tenderness.  Neurological: She is alert and oriented to person, place, and time.  Skin: Skin is dry. No rash noted.  Psychiatric: She has a normal mood and affect. Her behavior is normal. Judgment and thought content normal.          Assessment & Plan:  1. Obesity 2. Hair loss, dry skin---check Thyroid

## 2011-05-08 NOTE — Assessment & Plan Note (Signed)
con't diet and exercise Refill adipex rto 1 month

## 2011-05-08 NOTE — Patient Instructions (Signed)
Calorie Counting Diet A calorie counting diet requires you to eat the number of calories that are right for you during a day. Calories are the measurement of how much energy you get from the food you eat. Eating the right amount of calories is important for staying at a healthy weight. If you eat too many calories your body will store them as fat and you may gain weight. If you eat too few calories you may lose weight. Counting the number of calories that you eat during a day will help you to know if you're eating the right amount. A Registered Dietitian can determine how many calories you need in a day. The amount of calories you need varies from person to person. If your goal is to lose weight you will need to eat fewer calories. Losing weight can benefit you if you are overweight or have health problems such as heart disease, high blood pressure or diabetes. If your goal is to gain weight, you will need to eat more calories. Gaining weight may be necessary if you have a certain health problem that causes your body to need more energy. TIPS Whether you are increasing or decreasing the number of calories you eat during a day, it may be hard to get used to changing what you eat and drink. The following are tips to help you keep track of the number of calories you are eating.  Measuring foods at home with measuring cups will help you to know the actual amount of food and number of calories you are eating.   Restaurants serve food in all different portion sizes. It is common that restaurants will serve food in amounts worth 2 or more serving sizes. While eating out, it may be helpful to estimate how many servings of a food you are given. For example, a serving of cooked rice is 1/2 cup and that is the size of half of a fist. Knowing serving sizes will help you have a better idea of how much food you are eating at restaurants.   Ask for smaller portion sizes or child-size portions at restaurants.   Plan to  eat half of a meal at a restaurant and take the rest home or share the other half with a friend   Read food labels for calorie content and serving size   Most packaged food has a Nutrition Facts Panel on its side or back. Here you can find out how many servings are in a package, the size of a serving, and the number of calories each serving has.   The serving size and number of servings per container are listed right below the Nutrition Facts heading. Just below the serving information, the number of calories in each serving is listed.   For example, say that a package has three cookies inside. The Nutrition Facts panel says that one serving is one cookie. Below that, it says that there are three servings in the container. The calories section of the Nutrition Facts says there are 90 calories. That means that there are 90 calories in one cookie. If you eat one cookie you have eaten 90 calories. If you eat all three cookies, you have eaten three times that amount, or 270 calories.  The list below tells you how big or small some common portion sizes are.  1 ounce (oz).................4 stacked dice.   3 oz..............................Deck of cards.   1 teaspoon (tsp)...........Tip of little finger.   1 tablespoon (Tbsp)....Tip of thumb.     2 Tbsp..........................Golf ball.    Cup..........................Half of a fist.   1 Cup...........................A fist.  KEEP A FOOD LOG Write down every food item that you eat, how much of the food you eat, and the number of calories in each food that you eat during the day. At the end of the day or throughout the day you can add up the total number of calories you have eaten.  It may help to set up a list like the one below. Find out the calorie information by reading food labels.  Breakfast   Bran Flakes (1 cup, 110 calories).   Fat free milk ( cup, 45 calories).   Snack   Apple (1 medium, 80 calories).   Lunch   Spinach (1  cup, 20 calories).   Tomato ( medium, 20 calories).   Chicken breast strips (3 oz, 165 calories).   Shredded cheddar cheese ( cup, 110 calories).   Light Italian dressing (2 Tbsp, 60 calories).   Whole wheat bread (1 slice, 80 calories).   Tub margarine (1 tsp, 35 calories).   Vegetable soup (1 cup, 160 calories).   Dinner   Pork chop (3 oz, 190 calories).   Brown rice (1 cup, 215 calories).   Steamed broccoli ( cup, 20 calories).   Strawberries (1  cup, 65 calories).   Whipped cream (1 Tbsp, 50 calories).  Daily Calorie Total: 1425 Information from www.eatright.org, Foodwise Nutritional Analysis Database. Document Released: 09/07/2005 Document Re-Released: 09/29/2009 Exit[employer] Patient Information 2011 [employer]. 

## 2011-05-14 ENCOUNTER — Telehealth: Payer: Self-pay

## 2011-05-14 NOTE — Telephone Encounter (Signed)
See labs 

## 2011-05-14 NOTE — Telephone Encounter (Signed)
Call from patient and she wanted to get her TSH results. Please advise     KP

## 2011-05-14 NOTE — Telephone Encounter (Signed)
Patient aware results are normal--copy mailed     KP

## 2011-05-21 ENCOUNTER — Other Ambulatory Visit: Payer: Self-pay | Admitting: Family Medicine

## 2011-05-21 MED ORDER — ALPRAZOLAM 1 MG PO TABS: 1.0000 mg | ORAL_TABLET | Freq: Two times a day (BID) | ORAL | Status: DC

## 2011-05-21 NOTE — Telephone Encounter (Signed)
Last seen 05/08/11 and filled 04/24/11 please advise     KP

## 2011-05-21 NOTE — Telephone Encounter (Signed)
Faxed.   KP 

## 2011-06-09 ENCOUNTER — Ambulatory Visit: Payer: Managed Care, Other (non HMO) | Admitting: Family Medicine

## 2011-06-10 ENCOUNTER — Ambulatory Visit: Payer: Managed Care, Other (non HMO) | Admitting: Family Medicine

## 2011-06-11 ENCOUNTER — Ambulatory Visit (INDEPENDENT_AMBULATORY_CARE_PROVIDER_SITE_OTHER): Payer: Managed Care, Other (non HMO) | Admitting: Family Medicine

## 2011-06-11 ENCOUNTER — Encounter: Payer: Self-pay | Admitting: Family Medicine

## 2011-06-11 VITALS — BP 112/80 | HR 88 | Temp 99.1°F | Wt 205.2 lb

## 2011-06-11 DIAGNOSIS — E669 Obesity, unspecified: Secondary | ICD-10-CM

## 2011-06-11 DIAGNOSIS — M549 Dorsalgia, unspecified: Secondary | ICD-10-CM

## 2011-06-11 MED ORDER — PHENTERMINE HCL 37.5 MG PO CAPS: 37.5000 mg | ORAL_CAPSULE | ORAL | Status: AC

## 2011-06-11 MED ORDER — TRAMADOL HCL 50 MG PO TABS: 50.0000 mg | ORAL_TABLET | Freq: Four times a day (QID) | ORAL | Status: AC | PRN

## 2011-06-11 NOTE — Progress Notes (Signed)
  Subjective:    Patient ID: Veronica Yates, female    DOB: 09/14/1971, 40 y.o.   MRN: [id number]  HPI  Pt here f/u weight loss.  She was on vacation which is why she has not lost much.  No complaints.  Pt is exercising and is now watching her diet  Review of Systems as above   Objective:   Physical Exam  Constitutional: She is oriented to person, place, and time. She appears well-developed and well-nourished.  Cardiovascular: Normal rate, regular rhythm and normal heart sounds.   No murmur heard. Pulmonary/Chest: Effort normal and breath sounds normal. No respiratory distress. She has no wheezes.  Neurological: She is alert and oriented to person, place, and time.  Psychiatric: She has a normal mood and affect. Her behavior is normal. Judgment and thought content normal.          Assessment & Plan:  Obesity--- con't diet and exercise                  1 month of med--- if pt does not loose at least 4 lbs in a month-- med is not working

## 2011-06-11 NOTE — Patient Instructions (Signed)
Obesity Obesity is defined as having a Body Mass Index (BMI) of 30 or more. To calculate your BMI divide your weight in pounds by your height in inches squared and multiply that product by 703. Major illnesses resulting from long-term obesity include:  Stroke.   Heart disease.   Diabetes.   Many cancers.   Arthritis.  Obesity also complicates recovery from many other medical problems.  CAUSES  A history of obesity in your parents.   Thyroid hormone imbalance.   Environmental factors such as excess calorie intake and physical inactivity.  TREATMENT A healthy weight loss program includes:  A calorie restricted diet based on individual calorie needs.   Increased physical activity (exercise).  An exercise program is just as important as the right low calorie diet.  Weight-loss medicines should be used only under the supervision of your physician. These medicines help, but only if they are used with diet and exercise programs. Medicines can have side effects including nervousness, nausea, abdominal pain, diarrhea, headache, drowsiness, and depression.  An unhealthy weight loss program includes:  Fasting.   Fad diets.   Supplements and drugs.  These choices do not succeed in long-term weight control.  HOME CARE INSTRUCTIONS To help you make the needed dietary changes:   Keep a daily record of everything you eat. There are many free websites to help you with this. It may be helpful to measure your foods so you can determine if you are eating the correct portion sizes.   Use low-calorie cookbooks or take special cooking classes.   Avoid alcohol. Drink more water and drinks with no calories.   Take vitamins and supplements only as recommended by your caregiver.   Weight-loss support groups, Optometrist, counselors, and stress reduction education can also be very helpful.  PREVENTION Losing weight and keeping it off takes time, discipline, a healthy diet and regular  exercise. Document Released: 10/15/2004 Document Re-Released: 09/27/2007 [facility] Patient Information 2011 [place], [place].

## 2011-06-19 ENCOUNTER — Other Ambulatory Visit: Payer: Self-pay | Admitting: Family Medicine

## 2011-06-19 LAB — URINALYSIS, ROUTINE W REFLEX MICROSCOPIC
Bilirubin Urine: NEGATIVE
Glucose, UA: NEGATIVE
Hgb urine dipstick: NEGATIVE
Ketones, ur: NEGATIVE
Nitrite: NEGATIVE
Protein, ur: NEGATIVE
Specific Gravity, Urine: 1.01
Urobilinogen, UA: 0.2
pH: 6.5

## 2011-06-19 LAB — WET PREP, GENITAL
Trich, Wet Prep: NONE SEEN
Yeast Wet Prep HPF POC: NONE SEEN

## 2011-06-19 LAB — DIFFERENTIAL
Basophils Absolute: 0
Basophils Relative: 0
Eosinophils Absolute: 0.2
Eosinophils Relative: 2
Lymphocytes Relative: 28
Lymphs Abs: 2.6
Monocytes Absolute: 0.7
Monocytes Relative: 7
Neutro Abs: 5.8
Neutrophils Relative %: 63

## 2011-06-19 LAB — GC/CHLAMYDIA PROBE AMP, GENITAL: GC Probe Amp, Genital: NEGATIVE

## 2011-06-19 LAB — CBC
HCT: 36.9
Hemoglobin: 12.5
MCHC: 33.8
MCV: 91.5
Platelets: 340
RBC: 4.03
RDW: 14.3
WBC: 9.2

## 2011-06-19 LAB — PREGNANCY, URINE: Preg Test, Ur: NEGATIVE

## 2011-06-19 MED ORDER — ALPRAZOLAM 1 MG PO TABS: 1.0000 mg | ORAL_TABLET | Freq: Two times a day (BID) | ORAL | Status: DC

## 2011-06-19 NOTE — Telephone Encounter (Signed)
Last seen 06/11/11 and filled 05/21/11 please advise    KP

## 2011-06-19 NOTE — Telephone Encounter (Signed)
Faxed.   KP 

## 2011-06-22 LAB — URINALYSIS, ROUTINE W REFLEX MICROSCOPIC
Bilirubin Urine: NEGATIVE
Hgb urine dipstick: NEGATIVE
Ketones, ur: NEGATIVE
Specific Gravity, Urine: 1.015
pH: 8

## 2011-06-22 LAB — CBC
HCT: 38
MCHC: 33.6
MCV: 90.4
Platelets: 386
RBC: 4.2
WBC: 6.8

## 2011-06-22 LAB — PREGNANCY, URINE: Preg Test, Ur: NEGATIVE

## 2011-07-09 ENCOUNTER — Ambulatory Visit: Payer: Managed Care, Other (non HMO) | Admitting: Family Medicine

## 2011-07-09 DIAGNOSIS — Z0289 Encounter for other administrative examinations: Secondary | ICD-10-CM

## 2011-07-15 ENCOUNTER — Other Ambulatory Visit: Payer: Self-pay | Admitting: Family Medicine

## 2011-07-15 NOTE — Telephone Encounter (Signed)
Last seen 9/20/1 and filled 9/28/2 please advise    KP

## 2011-07-16 MED ORDER — ALPRAZOLAM 1 MG PO TABS: 1.0000 mg | ORAL_TABLET | Freq: Two times a day (BID) | ORAL | Status: DC

## 2011-07-16 NOTE — Telephone Encounter (Signed)
Faxed.   KP 

## 2011-08-17 ENCOUNTER — Other Ambulatory Visit: Payer: Self-pay | Admitting: Family Medicine

## 2011-08-18 MED ORDER — ALPRAZOLAM 1 MG PO TABS: 1.0000 mg | ORAL_TABLET | Freq: Two times a day (BID) | ORAL | Status: DC

## 2011-08-18 NOTE — Telephone Encounter (Signed)
Last seen 06/11/11 and filled 07/15/11 # 60... Please advise    KP

## 2011-08-28 ENCOUNTER — Ambulatory Visit: Payer: Managed Care, Other (non HMO) | Admitting: Family Medicine

## 2011-09-02 ENCOUNTER — Ambulatory Visit: Payer: Managed Care, Other (non HMO) | Admitting: Family Medicine

## 2011-09-02 DIAGNOSIS — Z0289 Encounter for other administrative examinations: Secondary | ICD-10-CM

## 2011-09-06 ENCOUNTER — Other Ambulatory Visit: Payer: Self-pay | Admitting: Family Medicine

## 2011-09-14 ENCOUNTER — Other Ambulatory Visit: Payer: Self-pay | Admitting: Family Medicine

## 2011-09-14 MED ORDER — ALPRAZOLAM 1 MG PO TABS: 1.0000 mg | ORAL_TABLET | Freq: Two times a day (BID) | ORAL | Status: DC

## 2011-09-14 NOTE — Telephone Encounter (Signed)
Faxed.   KP 

## 2011-09-14 NOTE — Telephone Encounter (Signed)
Addended by: Arnette Norris on: 09/14/2011 12:14 PM   Modules accepted: Orders

## 2011-09-14 NOTE — Telephone Encounter (Signed)
Last seen 06/11/11 and filled 08/18/11 # 60---- please advise    KP

## 2011-09-25 ENCOUNTER — Other Ambulatory Visit: Payer: Self-pay | Admitting: Dermatology

## 2011-10-15 ENCOUNTER — Other Ambulatory Visit: Payer: Self-pay | Admitting: Family Medicine

## 2011-10-15 MED ORDER — ALPRAZOLAM 1 MG PO TABS: 1.0000 mg | ORAL_TABLET | Freq: Two times a day (BID) | ORAL | Status: DC

## 2011-10-15 NOTE — Telephone Encounter (Signed)
Last seen 06/11/11 and filled 09/14/11 #60. Please advise      KP

## 2011-11-03 ENCOUNTER — Telehealth: Payer: Self-pay | Admitting: Family Medicine

## 2011-11-03 MED ORDER — SERTRALINE HCL 100 MG PO TABS: 200.0000 mg | ORAL_TABLET | Freq: Every day | ORAL | Status: DC

## 2011-11-03 NOTE — Telephone Encounter (Signed)
Refill: Sertraline hcl 100 mg tablet. Take 2 tablets by mouth every day. Qty 60. Last fill 09-10-11

## 2011-11-03 NOTE — Telephone Encounter (Signed)
Faxed.   KP 

## 2011-11-13 ENCOUNTER — Ambulatory Visit (INDEPENDENT_AMBULATORY_CARE_PROVIDER_SITE_OTHER): Payer: Managed Care, Other (non HMO) | Admitting: Family Medicine

## 2011-11-13 ENCOUNTER — Encounter: Payer: Self-pay | Admitting: Family Medicine

## 2011-11-13 VITALS — BP 108/66 | HR 87 | Temp 98.7°F | Wt 196.6 lb

## 2011-11-13 DIAGNOSIS — F411 Generalized anxiety disorder: Secondary | ICD-10-CM

## 2011-11-13 DIAGNOSIS — F419 Anxiety disorder, unspecified: Secondary | ICD-10-CM

## 2011-11-13 MED ORDER — CLONAZEPAM 0.5 MG PO TABS: 0.5000 mg | ORAL_TABLET | Freq: Two times a day (BID) | ORAL | Status: DC | PRN

## 2011-11-13 NOTE — Progress Notes (Signed)
  Subjective:     Veronica Yates is a 41 y.o. female who presents for follow up of anxiety disorder. Current symptoms: none. She denies current suicidal and homicidal ideation. She complains of the following side effects from the treatment: none. Pt would like to switch xanax to klonopin.   Her daughter is on klonopin and it last longer.   The following portions of the patient's history were reviewed and updated as appropriate: allergies, current medications, past family history, past medical history, past social history, past surgical history and problem list.    Objective:    BP 108/66  Pulse 87  Temp(Src) 98.7 F (37.1 C) (Oral)  Wt 196 lb 9.6 oz (89.177 kg)  SpO2 95%  General:  alert, cooperative, appears stated age and no distress  Affect/Behavior:  full facial expressions, good grooming, good insight, normal perception, normal reasoning, normal speech pattern and content and normal thought patterns none      Assessment:    Anxiety Disorder - improving    Plan:    Medications: benzodiazepines klonopin and Zoloft. Handouts describing disease, natural history, and treatment were given to the patient. Instructed patient to contact office or on-call physician promptly should condition worsen or any new symptoms appear and provided on-call telephone numbers. IF THE PATIENT HAS ANY SUICIDAL OR HOMICIDAL IDEATIONS, CALL THE OFFICE, DISCUSS WITH A SUPPORT MEMBER, OR GO TO THE ER IMMEDIATELY. Patient was agreeable with this plan. Follow up: 6 months. Spent 15 minutes (>50% of visit) discussing the risks of anxiety disorder, the  pathophysiology, etiology, risks, and principles of treatment.

## 2011-11-13 NOTE — Patient Instructions (Signed)

## 2011-11-25 ENCOUNTER — Telehealth: Payer: Self-pay

## 2011-11-25 NOTE — Telephone Encounter (Signed)
Is dose helping and just wearing off too quick or not working at all?

## 2011-11-25 NOTE — Telephone Encounter (Signed)
Patient is calling to follow up on her med change last office visit.  You changed her from Xanax to Klonopin. Patient states that the dosage of the Klonopin is not working. Can you please increase or change?

## 2011-11-26 NOTE — Telephone Encounter (Signed)
Increase to  1 mg bid

## 2011-11-26 NOTE — Telephone Encounter (Signed)
LMVM for patient to return call. 

## 2011-11-26 NOTE — Telephone Encounter (Signed)
Patient states it is helping some, but feels that the dosage isn't strong enough.  She said that she could easily take a whole tablet four times a day..it doesn't last as long.  Although, it doesn't make her as sleepy as the Xanax did.

## 2011-11-27 NOTE — Telephone Encounter (Signed)
LMVM for patient, regarding dosage change and to call back to give update on how it does for her.

## 2012-01-08 ENCOUNTER — Telehealth: Payer: Self-pay | Admitting: Family Medicine

## 2012-01-08 MED ORDER — CLONAZEPAM 1 MG PO TABS: 1.0000 mg | ORAL_TABLET | Freq: Two times a day (BID) | ORAL | Status: DC | PRN

## 2012-01-08 NOTE — Telephone Encounter (Signed)
Pt states she needs a new rx for klonopin with new directions to take 1mg  3x day. She can pick it up or we can send it to her pharmacy [facility] [address]. Call back # [phone]

## 2012-01-08 NOTE — Telephone Encounter (Signed)
I spoke with patient, patient was switched from xanax to klonopin. Per phone note on 11/25/2011 patient to increase med to 1 mg by mouth BID. Patient stated she needs rx sent in for what med was changed to on 11/25/11. Patient aware rx will be sent in today

## 2012-01-08 NOTE — Telephone Encounter (Signed)
Per Dr.Hopper protocol ok to fill med if patient not over-using and if patient recently seen

## 2012-01-18 ENCOUNTER — Telehealth: Payer: Self-pay | Admitting: Family Medicine

## 2012-01-18 MED ORDER — SERTRALINE HCL 100 MG PO TABS: 200.0000 mg | ORAL_TABLET | Freq: Every day | ORAL | Status: DC

## 2012-01-18 NOTE — Telephone Encounter (Signed)
Refill: Sertaline hcl 100mg  tablet. Request 90 day supply

## 2012-02-04 ENCOUNTER — Other Ambulatory Visit: Payer: Self-pay | Admitting: Family Medicine

## 2012-02-04 MED ORDER — CLONAZEPAM 1 MG PO TABS: 1.0000 mg | ORAL_TABLET | Freq: Two times a day (BID) | ORAL | Status: DC | PRN

## 2012-02-04 MED ORDER — SERTRALINE HCL 100 MG PO TABS: 200.0000 mg | ORAL_TABLET | Freq: Every day | ORAL | Status: DC

## 2012-02-04 NOTE — Telephone Encounter (Signed)
refill Clonazepam 1mg  tablet Take one tablet by mouth twice daily   Last written 2.22.13, qty 60 Instructions: Take one tablet by mouth two times daily as needed for anxiety Last OV 2.22.13

## 2012-02-04 NOTE — Telephone Encounter (Signed)
Last seen and filled 11/13/11 #60 with 1 refill. Please advise   KP

## 2012-02-04 NOTE — Telephone Encounter (Signed)
Ok to refill x 1  

## 2012-02-04 NOTE — Telephone Encounter (Signed)
Rx sent 

## 2012-02-04 NOTE — Telephone Encounter (Signed)
Addended by: Candie Echevaria L on: 02/04/2012 12:44 PM   Modules accepted: Orders

## 2012-02-04 NOTE — Telephone Encounter (Signed)
rejected patient insurance requires a 90-day supply for Sertraline, can you update and resend to pharmacy please? Thanks

## 2012-02-29 ENCOUNTER — Other Ambulatory Visit: Payer: Self-pay

## 2012-02-29 ENCOUNTER — Other Ambulatory Visit: Payer: Self-pay | Admitting: Family Medicine

## 2012-02-29 MED ORDER — SERTRALINE HCL 100 MG PO TABS: 200.0000 mg | ORAL_TABLET | Freq: Every day | ORAL | Status: DC

## 2012-02-29 MED ORDER — CLONAZEPAM 1 MG PO TABS: 1.0000 mg | ORAL_TABLET | Freq: Two times a day (BID) | ORAL | Status: DC | PRN

## 2012-02-29 NOTE — Telephone Encounter (Signed)
Refill zoloft for 6 months Refill klonopin x1 1 refill

## 2012-02-29 NOTE — Telephone Encounter (Signed)
refill clonazepam 1mg  tablet  Qty 60 Take one tablet by mouth two dimes a day as needed  Last wrt. 5.16.13 Last OV 2.22.13

## 2012-02-29 NOTE — Telephone Encounter (Signed)
Please advise      KP 

## 2012-02-29 NOTE — Telephone Encounter (Signed)
Also needed sertralin hcl 100mg  tablets Qty 60, qt/4-refills Rake 2 tablets by mouth every day  Last fill 04.07.13 Last of 2.22.13

## 2012-03-28 ENCOUNTER — Other Ambulatory Visit: Payer: Self-pay | Admitting: Family Medicine

## 2012-03-28 MED ORDER — NORTRIPTYLINE HCL 25 MG PO CAPS: 25.0000 mg | ORAL_CAPSULE | Freq: Every day | ORAL | Status: DC

## 2012-03-28 NOTE — Telephone Encounter (Signed)
refill nortriptyline hcl 25mg  cap #30, take one capsule every day  Last fill 6.3.13 Last ov 2.22.13

## 2012-04-01 ENCOUNTER — Other Ambulatory Visit: Payer: Self-pay | Admitting: Family Medicine

## 2012-04-01 MED ORDER — CLONAZEPAM 1 MG PO TABS: 1.0000 mg | ORAL_TABLET | Freq: Two times a day (BID) | ORAL | Status: DC | PRN

## 2012-04-01 NOTE — Telephone Encounter (Signed)
Refill x1,  2 refills 

## 2012-04-01 NOTE — Telephone Encounter (Signed)
Please advise      KP 

## 2012-04-01 NOTE — Telephone Encounter (Signed)
Refill-ClonazePAM (Tab) KLONOPIN 1 MG Take 1 tablet (1 mg total) by mouth 2 (two) times daily as needed. - last fill 6.10.13  Last ov 2.22.13

## 2012-04-01 NOTE — Telephone Encounter (Signed)
Faxed.   KP 

## 2012-04-25 ENCOUNTER — Other Ambulatory Visit: Payer: Self-pay | Admitting: Family Medicine

## 2012-04-25 NOTE — Telephone Encounter (Signed)
03/25/12 Rx is on file and they will fill it.     KP

## 2012-04-25 NOTE — Telephone Encounter (Signed)
Refill Nortriptyline HCl (Cap) 25 MG Take 1 capsule (25 mg total) by mouth daily, #30- LAST FILL 6.3.13  NOTE - CHART SHOWS this medication was refilled 7.8.13 #30 with 5-Refills  Last ov 2.22.13 - Anxiety

## 2012-06-02 ENCOUNTER — Other Ambulatory Visit: Payer: Self-pay | Admitting: Family Medicine

## 2012-06-02 ENCOUNTER — Other Ambulatory Visit: Payer: Self-pay | Admitting: General Practice

## 2012-06-02 MED ORDER — NORTRIPTYLINE HCL 25 MG PO CAPS: 25.0000 mg | ORAL_CAPSULE | Freq: Every day | ORAL | Status: DC

## 2012-06-02 MED ORDER — SERTRALINE HCL 100 MG PO TABS: 200.0000 mg | ORAL_TABLET | Freq: Every day | ORAL | Status: DC

## 2012-06-27 ENCOUNTER — Other Ambulatory Visit: Payer: Self-pay | Admitting: Family Medicine

## 2012-06-27 MED ORDER — CLONAZEPAM 1 MG PO TABS: ORAL_TABLET | ORAL | Status: DC

## 2012-06-27 NOTE — Telephone Encounter (Signed)
refill clonazepam 1mg  tablet --take 1 tablet by mouth twice a day as needed --- last fill 8.31.13, last ov 2.22.13 Meds Mgmt

## 2012-06-27 NOTE — Telephone Encounter (Signed)
Please offer this patient an Apt per Dr.Lowne--  KP

## 2012-06-27 NOTE — Telephone Encounter (Signed)
She was due for ov in August-- refill x1 only

## 2012-06-27 NOTE — Telephone Encounter (Signed)
Please advise      KP 

## 2012-06-28 NOTE — Telephone Encounter (Signed)
Called pt she is coming in for a 15-minute meds mgmt appt 10.14.13 at 915am NOTE pt has not had a CPE  Here since 2007 -- may want to recommend to pt when she comes in

## 2012-07-04 ENCOUNTER — Ambulatory Visit: Payer: Managed Care, Other (non HMO) | Admitting: Family Medicine

## 2012-07-04 DIAGNOSIS — Z0289 Encounter for other administrative examinations: Secondary | ICD-10-CM

## 2012-09-01 ENCOUNTER — Ambulatory Visit: Payer: Managed Care, Other (non HMO) | Admitting: Family Medicine

## 2012-09-01 DIAGNOSIS — Z0289 Encounter for other administrative examinations: Secondary | ICD-10-CM

## 2012-09-08 ENCOUNTER — Encounter: Payer: Self-pay | Admitting: Lab

## 2012-09-09 ENCOUNTER — Ambulatory Visit: Payer: Managed Care, Other (non HMO) | Admitting: Family Medicine

## 2012-09-09 DIAGNOSIS — Z0289 Encounter for other administrative examinations: Secondary | ICD-10-CM

## 2012-09-19 ENCOUNTER — Other Ambulatory Visit: Payer: Self-pay | Admitting: Family Medicine

## 2012-09-19 NOTE — Telephone Encounter (Signed)
per dr.lowne patient will not get anymore refills until seen due to 4-no shows for the same reason medication mgmt No showed 12.12.12--10.14.13--12.12.13--12.20.13

## 2012-11-05 ENCOUNTER — Other Ambulatory Visit: Payer: Self-pay

## 2012-12-06 ENCOUNTER — Encounter: Payer: Self-pay | Admitting: Lab

## 2012-12-07 ENCOUNTER — Encounter: Payer: Self-pay | Admitting: Family Medicine

## 2012-12-07 ENCOUNTER — Ambulatory Visit (INDEPENDENT_AMBULATORY_CARE_PROVIDER_SITE_OTHER): Payer: Managed Care, Other (non HMO) | Admitting: Family Medicine

## 2012-12-07 VITALS — BP 102/78 | HR 80 | Temp 98.6°F | Wt 217.0 lb

## 2012-12-07 DIAGNOSIS — F418 Other specified anxiety disorders: Secondary | ICD-10-CM

## 2012-12-07 DIAGNOSIS — F341 Dysthymic disorder: Secondary | ICD-10-CM

## 2012-12-07 MED ORDER — BUPROPION HCL ER (XL) 150 MG PO TB24: ORAL_TABLET | ORAL | Status: DC

## 2012-12-07 MED ORDER — SERTRALINE HCL 100 MG PO TABS: 200.0000 mg | ORAL_TABLET | Freq: Every day | ORAL | Status: DC

## 2012-12-07 MED ORDER — CLONAZEPAM 1 MG PO TABS: ORAL_TABLET | ORAL | Status: DC

## 2012-12-07 MED ORDER — BUPROPION HCL ER (XL) 300 MG PO TB24: 300.0000 mg | ORAL_TABLET | Freq: Every day | ORAL | Status: DC

## 2012-12-07 NOTE — Patient Instructions (Addendum)

## 2012-12-07 NOTE — Assessment & Plan Note (Signed)
Cont zoloft Add wellbutrin  rto 3 months or sooner prn

## 2012-12-07 NOTE — Progress Notes (Signed)
  Subjective:    Patient ID: Veronica Yates, female    DOB: 1971/05/05, 42 y.o.   MRN: [id number]  HPI  Pt here to f/u depression.  Pt feels like the zoloft is not working as well as it used to .---she has tried lexapro, nortriptyline, effexor in the past.  zoloft has worked the best so far.      Review of Systems As above    Objective:   Physical Exam  BP 102/78  Pulse 80  Temp(Src) 98.6 F (37 C) (Oral)  Wt 217 lb (98.431 kg)  BMI 38.45 kg/m2  SpO2 96% General appearance: alert, cooperative, appears stated age and no distress Psych-- pt doing well in office, no suicide       Assessment & Plan:

## 2012-12-29 ENCOUNTER — Encounter: Payer: Self-pay | Admitting: Family Medicine

## 2013-01-12 ENCOUNTER — Other Ambulatory Visit: Payer: Self-pay | Admitting: Family Medicine

## 2013-03-02 ENCOUNTER — Other Ambulatory Visit: Payer: Self-pay | Admitting: General Practice

## 2013-03-02 MED ORDER — BUPROPION HCL ER (XL) 150 MG PO TB24: 300.0000 mg | ORAL_TABLET | ORAL | Status: DC

## 2013-03-23 ENCOUNTER — Telehealth: Payer: Self-pay | Admitting: *Deleted

## 2013-03-23 DIAGNOSIS — F418 Other specified anxiety disorders: Secondary | ICD-10-CM

## 2013-03-23 MED ORDER — CLONAZEPAM 1 MG PO TABS: ORAL_TABLET | ORAL | Status: DC

## 2013-03-23 NOTE — Telephone Encounter (Signed)
Refill request for clonazepam 1mg  Last OV 3.19.14 Last filled 3.19.14

## 2013-03-23 NOTE — Telephone Encounter (Signed)
Ok to refill x 1  

## 2013-03-23 NOTE — Telephone Encounter (Signed)
Refill done.  

## 2013-03-30 ENCOUNTER — Ambulatory Visit (INDEPENDENT_AMBULATORY_CARE_PROVIDER_SITE_OTHER): Payer: Managed Care, Other (non HMO) | Admitting: Family Medicine

## 2013-03-30 ENCOUNTER — Ambulatory Visit (HOSPITAL_BASED_OUTPATIENT_CLINIC_OR_DEPARTMENT_OTHER)
Admission: RE | Admit: 2013-03-30 | Discharge: 2013-03-30 | Disposition: A | Discharge status: Home / Self Care | Payer: Managed Care, Other (non HMO) | Source: Ambulatory Visit | Attending: Family Medicine | Admitting: Family Medicine

## 2013-03-30 ENCOUNTER — Encounter: Payer: Self-pay | Admitting: Family Medicine

## 2013-03-30 VITALS — BP 122/78 | HR 94 | Temp 99.1°F | Wt 217.2 lb

## 2013-03-30 DIAGNOSIS — R0602 Shortness of breath: Secondary | ICD-10-CM

## 2013-03-30 DIAGNOSIS — R0789 Other chest pain: Secondary | ICD-10-CM

## 2013-03-30 LAB — D-DIMER, QUANTITATIVE: D-Dimer, Quant: 0.27 ug/mL-FEU (ref 0.00–0.48)

## 2013-03-30 NOTE — Progress Notes (Signed)
  Subjective:    Veronica Yates is a 42 y.o. female who presents for evaluation of chest pain. Onset was 4 days ago. Symptoms have worsened since that time. The patient describes the pain as tightness and does not radiate. Patient rates pain as a 5/10 in intensity. Associated symptoms are: chest pain and dyspnea. Aggravating factors are: deep inspiration. Alleviating factors are: none. Patient's cardiac risk factors are: none. Patient's risk factors for DVT/PE: none. Previous cardiac testing: none.  The following portions of the patient's history were reviewed and updated as appropriate: allergies, current medications, past family history, past medical history, past social history, past surgical history and problem list.  Review of Systems Pertinent items are noted in HPI.    Objective:    BP 122/78  Pulse 94  Temp(Src) 99.1 F (37.3 C) (Oral)  Wt 217 lb 3.2 oz (98.521 kg)  BMI 38.48 kg/m2  SpO2 97%  LMP 03/09/2013 General appearance: alert, cooperative, appears stated age and no distress Throat: lips, mucosa, and tongue normal; teeth and gums normal Neck: no adenopathy, supple, symmetrical, trachea midline and thyroid not enlarged, symmetric, no tenderness/mass/nodules Back: symmetric, no curvature. ROM normal. No CVA tenderness. Lungs: clear to auscultation bilaterally Heart: S1, S2 normal Extremities: extremities normal, atraumatic, no cyanosis or edema  Cardiographics ECG: normal sinus rhythm, no blocks or conduction defects, no ischemic changes  Imaging Chest x-ray: normal chest x-ray    Assessment:    Chest pain, suspected etiology: bronchospasm    Plan:    Patient history and exam consistent with non-cardiac cause of chest pain. Conservative measures indicated. OTC analgesics as needed. see meds and orders

## 2013-03-30 NOTE — Patient Instructions (Signed)
Using Your Inhaler Proper inhaler technique is very important. Good technique assures that the medicine reaches the lungs. Poor technique results in depositing the medicine on the tongue and back of the throat rather than in the airways. STEPS TO FOLLOW IF USING INHALER WITHOUT EXTENSION TUBE: 1. Remove cap from inhaler. 2. Shake inhaler for 5 seconds before each inhalation (breathing in). 3. Position the inhaler so that the top of the canister faces up. 4. Put your index finger on the top of the medication canister. Your thumb supports the bottom of the inhaler. 5. Open your mouth. 6. Hold the inhaler 1 to 2 inches away from your open mouth. This allows the medicine to slow down before the medicine enters the mouth. 7. Exhale (breathe out) normally and as completely as possible. 8. Press the canister down with the index finger to release the medication. 9. At the same time as the canister is pressed, inhale deeply and slowly until the lungs are completely filled. This should take 4 to 6 seconds. Keep your tongue down and out of the way. 10. Hold the medication in your lungs for up to 10 seconds (10 seconds is best). This helps the medicine get into the small airways of your lungs to work better. 11. Breathe out slowly, through pursed lips. Whistling is an example of pursed lips. 12. Wait at least 1 minute between puffs. Continue with the above steps until you have taken the number of puffs your caregiver has ordered. 13. Replace cap on inhaler. STEPS TO FOLLOW USING AN INHALER WITH AN EXTENSION (SPACER): 1.  Remove cap from inhaler. 2. Shake inhaler for 5 seconds before each inhalation (breathing in). 3. Your caregiver has asked you to use a spacer with your inhaler. A spacer is a plastic tube with a mouthpiece on one end and an opening that connects to the inhaler on the other end. A spacer helps you take the medicine better. 4. Place the open end of the spacer onto the mouthpiece of the  inhaler. 5. Position the inhaler so that the top of the canister faces up and the spacer mouthpiece faces you. 6. Put your index finger on the top of the medication canister. Your thumb supports the bottom of the inhaler and the spacer. 7. Exhale (breathe out) normally and as completely as possible. 8. Immediately after exhaling, place the spacer between your teeth and into your mouth. Close your mouth tightly around the spacer. 9. Press the canister down with the index finger to release the medication. 10. At the same time as the canister is pressed, inhale deeply and slowly until the lungs are completely filled. This should take 4 to 6 seconds. Keep your tongue down and out of the way. 11. Hold the medication in your lungs for up to 10 seconds (10 seconds is best). This helps the medicine get into the small airways of your lungs to work better. Exhale. 12. Repeat inhaling deeply through the spacer mouthpiece. Again hold that breath for up to 10 seconds (10 seconds is best). Exhale slowly. If it is difficult to take this second deep breath through the spacer, breathe normally several times through the spacer. Remove the spacer from your mouth. 13. Wait at least 1 minute between puffs. Continue with the above steps until you have taken the number of puffs your caregiver has ordered. 14. Remove spacer from the inhaler and place cap on inhaler. If you are using different kinds of inhalers, use your quick relief medicine to  open the airways 10 - 15 minutes before using a steroid. If you are unsure which inhalers to use and the order of using them, ask your caregiver, nurse, or respiratory therapist. If you are using a steroid inhaler, rinse your mouth with water after your last puff and then spit out the water. Do not swallow the water. Avoid the following:  Inhaling before or after starting the spray of medicine. It takes practice to coordinate your breathing with triggering the spray.  Inhaling  through the nose (rather than the mouth) when triggering the spray. HOW TO DETERMINE IF YOUR INHALER IS FULL OR NEARLY EMPTY:  Determine when an inhaler is empty. You cannot know when an inhaler is empty by shaking it. A few inhalers are now being made with dose counters. Ask your caregiver for a prescription that has a dose counter if you feel you need that extra help.  If your inhaler does not have a counter, check the number of doses in the inhaler before you use it. The canister or box will list the number of doses in the canister. Divide the total number of doses in the canister by the number you will use each day to find how many days the canister will last. (For example, if your canister has 200 doses and you take 2 puffs, 4 times each day, which is 8 puffs a day. Dividing 200 by 8 equals 25. The canister should last 25 days.) Using a calendar, count forward that many days to see when your inhaler will run out. Write the refill date on a calendar or your canister.  Remember, if you need to take extra doses, the inhaler will empty sooner than you figured. Be sure you have a refill before your canister runs out. Refill your inhaler 7 to 10 days before it runs out. HOME CARE INSTRUCTIONS   Do not use the inhaler more than your caregiver tells you. If you are still wheezing and are feeling tightness in your chest, call your caregiver.  Keep an adequate supply of medication. This includes making sure the medicine is not expired, and you have a spare inhaler.  Follow your caregiver or inhaler insert directions for cleaning the inhaler and spacer. SEEK MEDICAL CARE IF:   Symptoms are only partially relieved with your inhaler.  You are having trouble using your inhaler.  You experience some increase in phlegm.  You develop a fever of 100.5 F (38.1 C). SEEK IMMEDIATE MEDICAL CARE IF:   You feel little or no relief with your inhalers. You are still wheezing and are feeling shortness of  breath and/or tightness in your chest.  You have side effects such as dizziness, headaches or fast heart rate.  You have chills, fever, night sweats or an oral temperature above 102 F (38.9 C).  Phlegm production increases a lot, or there is blood in the phlegm. MAKE SURE YOU:   Understand these instructions.  Will watch your condition.  Will get help right away if you are not doing well or get worse. Document Released: 09/04/2000 Document Revised: 11/30/2011 Document Reviewed: 06/25/2009 [facility] Patient Information 2014 [place], [place].

## 2013-03-31 LAB — HEPATIC FUNCTION PANEL
ALT: 23 U/L (ref 0–35)
Bilirubin, Direct: 0 mg/dL (ref 0.0–0.3)
Total Bilirubin: 0.4 mg/dL (ref 0.3–1.2)

## 2013-03-31 LAB — CBC WITH DIFFERENTIAL/PLATELET
Basophils Absolute: 0 10*3/uL (ref 0.0–0.1)
Eosinophils Absolute: 0.1 10*3/uL (ref 0.0–0.7)
Eosinophils Relative: 1.5 % (ref 0.0–5.0)
HCT: 37.2 % (ref 36.0–46.0)
Lymphs Abs: 3 10*3/uL (ref 0.7–4.0)
MCHC: 34.7 g/dL (ref 30.0–36.0)
MCV: 86.1 fl (ref 78.0–100.0)
Monocytes Absolute: 0.5 10*3/uL (ref 0.1–1.0)
Neutrophils Relative %: 50.8 % (ref 43.0–77.0)
Platelets: 320 10*3/uL (ref 150.0–400.0)
RDW: 14.7 % — ABNORMAL HIGH (ref 11.5–14.6)

## 2013-03-31 LAB — BASIC METABOLIC PANEL
BUN: 16 mg/dL (ref 6–23)
Chloride: 99 mEq/L (ref 96–112)
Creatinine, Ser: 0.9 mg/dL (ref 0.4–1.2)
Glucose, Bld: 108 mg/dL — ABNORMAL HIGH (ref 70–99)
Potassium: 3.7 mEq/L (ref 3.5–5.1)

## 2013-03-31 MED ORDER — ALBUTEROL SULFATE (2.5 MG/3ML) 0.083% IN NEBU
2.5000 mg | INHALATION_SOLUTION | Freq: Once | RESPIRATORY_TRACT | Status: AC
  Administered 2013-03-31: 2.5 mg via RESPIRATORY_TRACT

## 2013-03-31 NOTE — Addendum Note (Signed)
Addended by: Arnette Norris on: 03/31/2013 08:16 AM   Modules accepted: Orders

## 2013-04-18 ENCOUNTER — Telehealth: Payer: Self-pay | Admitting: *Deleted

## 2013-04-18 NOTE — Telephone Encounter (Signed)
Pharmacy is requesting a refill of clonazepam 1 mg.  Last ov 03/30/13 and Last date filled 03/23/13 60 ct. Patient has a controlled substance contract on file. Also this is a Dr. Laury Axon Patient. Please Advise.

## 2013-04-18 NOTE — Telephone Encounter (Signed)
OK # 60 1 bid prn only

## 2013-04-19 ENCOUNTER — Other Ambulatory Visit: Payer: Self-pay | Admitting: *Deleted

## 2013-04-19 DIAGNOSIS — F418 Other specified anxiety disorders: Secondary | ICD-10-CM

## 2013-04-19 MED ORDER — CLONAZEPAM 1 MG PO TABS: ORAL_TABLET | ORAL | Status: DC

## 2013-04-19 NOTE — Telephone Encounter (Signed)
Rx printed and placed on ledge for signature  

## 2013-05-21 ENCOUNTER — Other Ambulatory Visit: Payer: Self-pay | Admitting: Internal Medicine

## 2013-05-23 NOTE — Telephone Encounter (Signed)
Last refilled: 04/19/2013  Last visit: 03/30/2013  UDS: 12/07/2012-Low Risk  Contract on file  Please advise. SW, CMA

## 2013-05-30 NOTE — Telephone Encounter (Signed)
Refill x1   2 refills 

## 2013-05-30 NOTE — Telephone Encounter (Signed)
Dr Lowne pt 

## 2013-05-31 MED ORDER — CLONAZEPAM 1 MG PO TABS: ORAL_TABLET | ORAL | Status: DC

## 2013-05-31 NOTE — Addendum Note (Signed)
Addended by: Arnette Norris on: 05/31/2013 08:43 AM   Modules accepted: Orders

## 2013-07-27 ENCOUNTER — Other Ambulatory Visit: Payer: Self-pay

## 2013-08-28 ENCOUNTER — Other Ambulatory Visit: Payer: Self-pay | Admitting: Family Medicine

## 2013-08-29 NOTE — Telephone Encounter (Signed)
Last seen 03/30/13 and filled 05/31/13 #60 with 2 refills. Please advise      KP

## 2013-10-07 ENCOUNTER — Other Ambulatory Visit: Payer: Self-pay | Admitting: Family Medicine

## 2013-10-13 ENCOUNTER — Other Ambulatory Visit: Payer: Self-pay

## 2013-10-13 MED ORDER — BUPROPION HCL ER (XL) 150 MG PO TB24: ORAL_TABLET | ORAL | Status: DC

## 2013-11-26 ENCOUNTER — Other Ambulatory Visit: Payer: Self-pay | Admitting: Family Medicine

## 2013-11-27 NOTE — Telephone Encounter (Signed)
Last seen 03/30/13 and filled 08/28/13 #60 with 2 refills.    Please advise      KP

## 2014-01-20 ENCOUNTER — Telehealth: Payer: Self-pay | Admitting: Family Medicine

## 2014-01-24 MED ORDER — BUPROPION HCL ER (XL) 150 MG PO TB24: ORAL_TABLET | ORAL | Status: DC

## 2014-01-24 NOTE — Telephone Encounter (Signed)
Caller name: Melisa Relation to pt: Call back [phone] Pharmacy: [facility] [address]  Reason for call:  Pt is needing a refill on RX buPROPion (WELLBUTRIN XL) 150 MG 24 hr .  Pt has med follow apt on 01/29/14, just needing something until then.

## 2014-01-24 NOTE — Telephone Encounter (Signed)
Apt pending, insurance will on pay for 90 days. Rx sent     KP

## 2014-01-29 ENCOUNTER — Ambulatory Visit: Payer: Managed Care, Other (non HMO) | Admitting: Family Medicine

## 2014-01-29 DIAGNOSIS — Z0289 Encounter for other administrative examinations: Secondary | ICD-10-CM

## 2014-02-02 ENCOUNTER — Ambulatory Visit: Payer: Managed Care, Other (non HMO) | Admitting: Family Medicine

## 2014-02-05 ENCOUNTER — Ambulatory Visit (INDEPENDENT_AMBULATORY_CARE_PROVIDER_SITE_OTHER): Payer: Managed Care, Other (non HMO) | Admitting: Family Medicine

## 2014-02-05 ENCOUNTER — Encounter: Payer: Self-pay | Admitting: Family Medicine

## 2014-02-05 VITALS — BP 110/70 | HR 86 | Temp 98.7°F | Wt 209.0 lb

## 2014-02-05 DIAGNOSIS — L709 Acne, unspecified: Secondary | ICD-10-CM

## 2014-02-05 DIAGNOSIS — F329 Major depressive disorder, single episode, unspecified: Secondary | ICD-10-CM

## 2014-02-05 DIAGNOSIS — F3289 Other specified depressive episodes: Secondary | ICD-10-CM

## 2014-02-05 DIAGNOSIS — L708 Other acne: Secondary | ICD-10-CM

## 2014-02-05 DIAGNOSIS — F32A Depression, unspecified: Secondary | ICD-10-CM

## 2014-02-05 MED ORDER — CLONAZEPAM 1 MG PO TABS: ORAL_TABLET | ORAL | Status: DC

## 2014-02-05 MED ORDER — BUPROPION HCL ER (XL) 150 MG PO TB24: ORAL_TABLET | ORAL | Status: DC

## 2014-02-05 MED ORDER — MINOCYCLINE HCL 100 MG PO CAPS: 100.0000 mg | ORAL_CAPSULE | Freq: Two times a day (BID) | ORAL | Status: DC

## 2014-02-05 MED ORDER — CLINDAMYCIN PHOS-BENZOYL PEROX 1-5 % EX GEL: Freq: Two times a day (BID) | CUTANEOUS | Status: DC

## 2014-02-05 MED ORDER — SERTRALINE HCL 100 MG PO TABS: ORAL_TABLET | ORAL | Status: DC

## 2014-02-05 NOTE — Patient Instructions (Signed)

## 2014-02-05 NOTE — Progress Notes (Signed)
Pre visit review using our clinic review tool, if applicable. No additional management support is needed unless otherwise documented below in the visit note. 

## 2014-02-05 NOTE — Progress Notes (Signed)
   Subjective:    Patient ID: Veronica Yates, female    DOB: 1971/09/11, 43 y.o.   MRN: [id number]  HPI Pt here f/u depression.  She is doing well with her meds.  She also requesting meds for her acne.  It is worse on her cheeks and chin. She has an appointment with derm but it won't be for 3-4 months.    Review of Systems As above    Objective:   Physical Exam  BP 110/70  Pulse 86  Temp(Src) 98.7 F (37.1 C) (Oral)  Wt 209 lb (94.802 kg)  SpO2 94%  LMP 01/22/2014 General appearance: alert, cooperative, appears stated age and no distress Lungs: clear to auscultation bilaterally Heart: regular rate and rhythm, S1, S2 normal, no murmur, click, rub or gallop Skin: acne--cystic on cheeks, chin Psych-- no depression, no anxiety      Assessment & Plan:  1. Depression controlled - sertraline (ZOLOFT) 100 MG tablet; Take 2 tablets by mouth daily-  Dispense: 180 tablet; Refill: 3 - buPROPion (WELLBUTRIN XL) 150 MG 24 hr tablet; 2 tab by mouth in the morning--  Dispense: 180 tablet; Refill: 3 - clonazePAM (KLONOPIN) 1 MG tablet; TAKE 1 TABLET BY MOUTH TWICE DAILY AS NEEDED  Dispense: 60 tablet; Refill: 2  2. Acne rto 3 months or sooner prn - minocycline (MINOCIN) 100 MG capsule; Take 1 capsule (100 mg total) by mouth 2 (two) times daily.  Dispense: 60 capsule; Refill: 2 - clindamycin-benzoyl peroxide (BENZACLIN) gel; Apply topically 2 (two) times daily.  Dispense: 25 g; Refill: 2

## 2014-02-08 ENCOUNTER — Ambulatory Visit: Payer: Managed Care, Other (non HMO) | Admitting: Family Medicine

## 2014-02-08 DIAGNOSIS — Z0289 Encounter for other administrative examinations: Secondary | ICD-10-CM

## 2014-05-07 ENCOUNTER — Ambulatory Visit: Payer: Managed Care, Other (non HMO) | Admitting: Family Medicine

## 2014-05-11 ENCOUNTER — Telehealth: Payer: Self-pay

## 2014-05-11 DIAGNOSIS — F329 Major depressive disorder, single episode, unspecified: Secondary | ICD-10-CM

## 2014-05-11 DIAGNOSIS — F32A Depression, unspecified: Secondary | ICD-10-CM

## 2014-05-11 MED ORDER — CLONAZEPAM 1 MG PO TABS: ORAL_TABLET | ORAL | Status: DC

## 2014-05-11 NOTE — Telephone Encounter (Signed)
Refill x1,  2 refills 

## 2014-05-11 NOTE — Telephone Encounter (Signed)
Last seen and filled 02/05/14 #60 with 2 refills. Please advise      KP

## 2014-05-11 NOTE — Telephone Encounter (Signed)
Rx printed and faxed     KP 

## 2014-05-14 ENCOUNTER — Ambulatory Visit: Payer: Managed Care, Other (non HMO) | Admitting: Family Medicine

## 2014-05-14 DIAGNOSIS — Z0289 Encounter for other administrative examinations: Secondary | ICD-10-CM

## 2014-06-08 ENCOUNTER — Telehealth: Payer: Self-pay | Admitting: Family Medicine

## 2014-06-08 ENCOUNTER — Other Ambulatory Visit: Payer: Self-pay | Admitting: Family Medicine

## 2014-06-08 NOTE — Telephone Encounter (Signed)
Patient is calling to get a refill on her Klonopin, states pharmacy has been faxing a request for 3 days

## 2014-06-08 NOTE — Telephone Encounter (Signed)
Detailed message left advising the patient that her Rx faxed on 05/11/14 #60 has 2 refills on it, so she will need to call the pharmacy.     KP

## 2014-06-22 ENCOUNTER — Ambulatory Visit: Payer: Managed Care, Other (non HMO) | Admitting: Medical

## 2014-08-07 ENCOUNTER — Telehealth: Payer: Self-pay

## 2014-08-07 DIAGNOSIS — F32A Depression, unspecified: Secondary | ICD-10-CM

## 2014-08-07 DIAGNOSIS — F329 Major depressive disorder, single episode, unspecified: Secondary | ICD-10-CM

## 2014-08-07 MED ORDER — CLONAZEPAM 1 MG PO TABS
ORAL_TABLET | ORAL | Status: DC
Start: 2014-08-07 — End: 2014-09-01

## 2014-08-07 NOTE — Telephone Encounter (Signed)
Refill x1 

## 2014-08-07 NOTE — Telephone Encounter (Signed)
Klonopin refill  Last seen 02/05/14 and filled 05/11/14 #60 with 2 refills. UDS 12/08/12 low risk   Please advise     KP

## 2014-08-07 NOTE — Telephone Encounter (Signed)
Rx faxed.    KP 

## 2014-09-01 ENCOUNTER — Other Ambulatory Visit: Payer: Self-pay | Admitting: Family Medicine

## 2014-09-03 NOTE — Telephone Encounter (Signed)
Last seen 02/05/14 and filled 08/07/14 #60  Please advise     KP

## 2014-09-11 ENCOUNTER — Encounter: Payer: Self-pay | Admitting: Family Medicine

## 2014-09-11 ENCOUNTER — Ambulatory Visit (INDEPENDENT_AMBULATORY_CARE_PROVIDER_SITE_OTHER): Payer: Managed Care, Other (non HMO) | Admitting: Family Medicine

## 2014-09-11 VITALS — BP 112/78 | HR 86 | Temp 98.9°F | Resp 16 | Ht 62.0 in | Wt 209.8 lb

## 2014-09-11 DIAGNOSIS — Z23 Encounter for immunization: Secondary | ICD-10-CM

## 2014-09-11 DIAGNOSIS — F909 Attention-deficit hyperactivity disorder, unspecified type: Secondary | ICD-10-CM

## 2014-09-11 DIAGNOSIS — F988 Other specified behavioral and emotional disorders with onset usually occurring in childhood and adolescence: Secondary | ICD-10-CM | POA: Insufficient documentation

## 2014-09-11 MED ORDER — AMPHETAMINE-DEXTROAMPHETAMINE 20 MG PO TABS: 20.0000 mg | ORAL_TABLET | Freq: Two times a day (BID) | ORAL | Status: DC

## 2014-09-11 NOTE — Progress Notes (Signed)
Pre visit review using our clinic review tool, if applicable. No additional management support is needed unless otherwise documented below in the visit note. 

## 2014-09-11 NOTE — Progress Notes (Signed)
   Subjective:    Patient ID: Veronica CrockerMelissa Hedlund, female    DOB: [birth year], 43 y.o.   MRN: [id number]  HPI Pt here to discuss ADD meds-- she was diagnosed with ADD about 5 years ago and was on adderall bid for about 1 year when she stopped.  She now has a new job and she would like to go back on it. She is struggling with focus/concentration.     Review of Systems  Constitutional: Negative for activity change, appetite change, fatigue and unexpected weight change.  Psychiatric/Behavioral: Positive for dysphoric mood and decreased concentration. Negative for suicidal ideas, hallucinations, behavioral problems, confusion, sleep disturbance, self-injury and agitation. The patient is not nervous/anxious and is not hyperactive.   all other ros neg     Objective:   Physical Exam  BP 112/78 mmHg  Pulse 86  Temp(Src) 98.9 F (37.2 C) (Oral)  Resp 16  Ht 5\' 2"  (1.575 m)  Wt 209 lb 12.8 oz (95.165 kg)  BMI 38.36 kg/m2  SpO2 96% General appearance: alert, cooperative, appears stated age and no distress Lungs: clear to auscultation bilaterally Heart: S1, S2 normal Extremities: extremities normal, atraumatic, no cyanosis or edema Neurologic: Alert and oriented X 3, normal strength and tone. Normal symmetric reflexes. Normal coordination and gait       Assessment & Plan:  1. Need for prophylactic vaccination and inoculation against influenza   - Flu Vaccine QUAD 36+ mos PF IM (Fluarix Quad PF)  2. ADD (attention deficit disorder) rto 6 months or sooner prn  - amphetamine-dextroamphetamine (ADDERALL) 20 MG tablet; Take 1 tablet (20 mg total) by mouth 2 (two) times daily.  Dispense: 60 tablet; Refill: 0

## 2014-09-11 NOTE — Patient Instructions (Signed)

## 2014-09-12 ENCOUNTER — Encounter: Payer: Self-pay | Admitting: Family Medicine

## 2014-10-08 ENCOUNTER — Encounter: Payer: Self-pay | Admitting: Family Medicine

## 2014-10-09 ENCOUNTER — Other Ambulatory Visit: Payer: Self-pay | Admitting: Family Medicine

## 2014-10-09 DIAGNOSIS — F988 Other specified behavioral and emotional disorders with onset usually occurring in childhood and adolescence: Secondary | ICD-10-CM

## 2014-10-10 ENCOUNTER — Other Ambulatory Visit: Payer: Self-pay | Admitting: Family Medicine

## 2014-10-10 MED ORDER — AMPHETAMINE-DEXTROAMPHETAMINE 20 MG PO TABS: 20.0000 mg | ORAL_TABLET | Freq: Two times a day (BID) | ORAL | Status: DC

## 2014-10-10 MED ORDER — CLONAZEPAM 1 MG PO TABS: 1.0000 mg | ORAL_TABLET | Freq: Two times a day (BID) | ORAL | Status: DC | PRN

## 2014-10-10 NOTE — Telephone Encounter (Signed)
Last seen 09/11/14   Adderall filled 09/11/14 #60 Klonopin filled 09/03/14 #60  UDS 12/07/12 Low risk   Please advise    KP

## 2014-10-10 NOTE — Addendum Note (Signed)
Addended by: Arnette NorrisPAYNE, Everlee Quakenbush P on: 10/10/2014 10:55 AM   Modules accepted: Orders

## 2014-10-11 NOTE — Telephone Encounter (Signed)
Kim-- Looks like clonazepam rx was printed on 10/10/14. Do you know if this was done?

## 2014-11-10 ENCOUNTER — Other Ambulatory Visit: Payer: Self-pay | Admitting: Family Medicine

## 2014-11-10 DIAGNOSIS — F988 Other specified behavioral and emotional disorders with onset usually occurring in childhood and adolescence: Secondary | ICD-10-CM

## 2014-11-12 MED ORDER — CLONAZEPAM 1 MG PO TABS: 1.0000 mg | ORAL_TABLET | Freq: Two times a day (BID) | ORAL | Status: DC | PRN

## 2014-11-12 MED ORDER — AMPHETAMINE-DEXTROAMPHETAMINE 20 MG PO TABS: 20.0000 mg | ORAL_TABLET | Freq: Two times a day (BID) | ORAL | Status: DC

## 2014-11-12 NOTE — Telephone Encounter (Signed)
Patient aware med's are ready for pick up.      KP 

## 2014-11-12 NOTE — Telephone Encounter (Signed)
Last seen 09/11/14 and filled 10/10/14 #60 UDS low risk 12/07/12     Please advise      KP

## 2014-11-13 ENCOUNTER — Telehealth: Payer: Self-pay | Admitting: Family Medicine

## 2014-11-13 NOTE — Telephone Encounter (Signed)
Please advise if you were putting refills on this?     KP

## 2014-11-13 NOTE — Telephone Encounter (Signed)
Caller name: Romeo AppleBen from [facility]   Call back number: [phone]   Reason for call:  Pharmacy in need of clarification regarding clonazePAM (KLONOPIN) 1 MG tablet, refills or no refills.

## 2014-12-06 ENCOUNTER — Encounter: Payer: Self-pay | Admitting: Family Medicine

## 2014-12-07 ENCOUNTER — Encounter: Payer: Self-pay | Admitting: Family Medicine

## 2014-12-07 MED ORDER — AMPHETAMINE-DEXTROAMPHET ER 30 MG PO CP24: 30.0000 mg | ORAL_CAPSULE | Freq: Two times a day (BID) | ORAL | Status: DC

## 2014-12-07 NOTE — Telephone Encounter (Signed)
Inc to adderall 30 mg bid #60  ---make ov

## 2014-12-10 MED ORDER — CLONAZEPAM 1 MG PO TABS: 1.0000 mg | ORAL_TABLET | Freq: Two times a day (BID) | ORAL | Status: DC | PRN

## 2014-12-10 NOTE — Telephone Encounter (Signed)
Refill x1 

## 2014-12-10 NOTE — Addendum Note (Signed)
Addended by: Arnette NorrisPAYNE, Zayaan Kozak P on: 12/10/2014 11:27 AM   Modules accepted: Orders

## 2014-12-21 ENCOUNTER — Other Ambulatory Visit: Payer: Self-pay | Admitting: Family Medicine

## 2014-12-25 ENCOUNTER — Other Ambulatory Visit: Payer: Self-pay | Admitting: Family Medicine

## 2014-12-25 DIAGNOSIS — F988 Other specified behavioral and emotional disorders with onset usually occurring in childhood and adolescence: Secondary | ICD-10-CM

## 2014-12-25 MED ORDER — AMPHETAMINE-DEXTROAMPHET ER 30 MG PO CP24: ORAL_CAPSULE | ORAL | Status: DC

## 2014-12-25 NOTE — Telephone Encounter (Signed)
They will not let her fill both--- ok to give rx for new dose--- may not take 4 days ----  We can have [place] work on it first thing tomorrow and see what they say

## 2014-12-25 NOTE — Telephone Encounter (Signed)
She is already taking this dose.       KP

## 2014-12-25 NOTE — Telephone Encounter (Signed)
We can try 30 mg 2 a day ---adderall xr 30 mg 2 po qd

## 2014-12-31 ENCOUNTER — Telehealth: Payer: Self-pay | Admitting: Family Medicine

## 2014-12-31 DIAGNOSIS — F988 Other specified behavioral and emotional disorders with onset usually occurring in childhood and adolescence: Secondary | ICD-10-CM

## 2014-12-31 NOTE — Telephone Encounter (Signed)
Caller name: Romeo AppleBen from [facility] Relation to pt: Call back number: [phone] Pharmacy:  Reason for call:   Wants to know if we received prior auth info on generic Adderall XR 30?

## 2014-12-31 NOTE — Telephone Encounter (Signed)
PA initiated. Awaiting determination. JG//CMA 

## 2015-01-01 NOTE — Telephone Encounter (Signed)
PA approved effective 12/31/2014 through 12/30/2017. Approval letter sent for scanning. JG//CMA

## 2015-01-02 NOTE — Telephone Encounter (Signed)
Ben states that Patient reRomeo Applequests new rx and prior Berkley Harveyauth is still required. Best# [phone]

## 2015-01-02 NOTE — Telephone Encounter (Signed)
Romeo AppleBen called [employer] and states that there is no auth on file.  Please call [phone] to get prior auth.

## 2015-01-03 NOTE — Telephone Encounter (Signed)
Call from the patient and she said the pharmacy said the PA was not complete.PA was approved for the regular 30 mg Adderall. I made the rep aware it was for the XR and she spoke her manager who got it approved. I made the phamarcy aware, patient aware.Veronica Yates.    KP

## 2015-01-04 MED ORDER — AMPHETAMINE-DEXTROAMPHET ER 30 MG PO CP24: ORAL_CAPSULE | ORAL | Status: DC

## 2015-01-04 NOTE — Telephone Encounter (Signed)
Left message on voicemail to notify patient.

## 2015-01-04 NOTE — Telephone Encounter (Signed)
Call back from the patient and the pharmacy advised since she got half of the script the last time they will need a anew RX for the Adderall. Please advise if this is ok.    Please advise     KP

## 2015-01-04 NOTE — Telephone Encounter (Signed)
Refill at front desk for pick up.  Filled in PCP absence. She should schedule an appointment with Dr. Laury AxonLowne to discuss other options if co-pay is too high.

## 2015-01-04 NOTE — Addendum Note (Signed)
Addended by: Arnette NorrisPAYNE, Calysta Craigo P on: 01/04/2015 12:41 PM   Modules accepted: Orders

## 2015-01-04 NOTE — Telephone Encounter (Signed)
Spoke with patient and she stated her co-pay was 70 dollars and she  Is going to get it this time but she may discuss with Dr.Lowne about getting something more affordable. She stated she has an upcoming apt this week and will discuss at her apt, I made her aware to call the insurance company and see if they have something for affordable that is covered, the patient agreed to do so.     KP

## 2015-01-04 NOTE — Addendum Note (Signed)
Addended by: Marcelline MatesMARTIN, Beda Dula on: 01/04/2015 01:15 PM   Modules accepted: Orders

## 2015-01-10 ENCOUNTER — Ambulatory Visit: Payer: Managed Care, Other (non HMO) | Admitting: Family Medicine

## 2015-01-10 DIAGNOSIS — Z0289 Encounter for other administrative examinations: Secondary | ICD-10-CM

## 2015-01-11 ENCOUNTER — Telehealth: Payer: Self-pay | Admitting: Family Medicine

## 2015-01-11 ENCOUNTER — Encounter: Payer: Self-pay | Admitting: Family Medicine

## 2015-01-11 NOTE — Telephone Encounter (Signed)
-   correction charge

## 2015-01-11 NOTE — Telephone Encounter (Signed)
Pt was no show for appt on 4/21- provider requested no charge- letter sent.

## 2015-01-20 ENCOUNTER — Other Ambulatory Visit: Payer: Self-pay | Admitting: Family Medicine

## 2015-01-21 MED ORDER — CLONAZEPAM 1 MG PO TABS: 1.0000 mg | ORAL_TABLET | Freq: Two times a day (BID) | ORAL | Status: DC | PRN

## 2015-01-21 NOTE — Telephone Encounter (Signed)
Last seen 09/11/14 and 12/10/14 #60 UDS 12/07/12 low risk   Please advise     KP

## 2015-01-22 ENCOUNTER — Ambulatory Visit (INDEPENDENT_AMBULATORY_CARE_PROVIDER_SITE_OTHER): Payer: Managed Care, Other (non HMO) | Admitting: Family Medicine

## 2015-01-22 ENCOUNTER — Encounter: Payer: Self-pay | Admitting: Family Medicine

## 2015-01-22 VITALS — BP 118/76 | HR 70 | Temp 99.0°F | Wt 197.0 lb

## 2015-01-22 DIAGNOSIS — Z1231 Encounter for screening mammogram for malignant neoplasm of breast: Secondary | ICD-10-CM

## 2015-01-22 DIAGNOSIS — F341 Dysthymic disorder: Secondary | ICD-10-CM | POA: Diagnosis not present

## 2015-01-22 DIAGNOSIS — F909 Attention-deficit hyperactivity disorder, unspecified type: Secondary | ICD-10-CM | POA: Diagnosis not present

## 2015-01-22 DIAGNOSIS — L700 Acne vulgaris: Secondary | ICD-10-CM

## 2015-01-22 DIAGNOSIS — F988 Other specified behavioral and emotional disorders with onset usually occurring in childhood and adolescence: Secondary | ICD-10-CM

## 2015-01-22 MED ORDER — SERTRALINE HCL 100 MG PO TABS: ORAL_TABLET | ORAL | Status: DC

## 2015-01-22 MED ORDER — AMPHETAMINE-DEXTROAMPHET ER 30 MG PO CP24: ORAL_CAPSULE | ORAL | Status: DC

## 2015-01-22 MED ORDER — CLINDAMYCIN PHOSPHATE 1 % EX LOTN: TOPICAL_LOTION | Freq: Two times a day (BID) | CUTANEOUS | Status: DC

## 2015-01-22 NOTE — Patient Instructions (Signed)

## 2015-01-22 NOTE — Progress Notes (Signed)
Pre visit review using our clinic review tool, if applicable. No additional management support is needed unless otherwise documented below in the visit note. 

## 2015-01-23 NOTE — Progress Notes (Signed)
Patient ID: Veronica Yates Macera, female    DOB: 1970-10-08  Age: 44 y.o. MRN: [id number]    Subjective:  Subjective HPI Veronica Yates Autry presents for f/u ADD and needs refills.    Review of Systems  Constitutional: Negative for activity change, appetite change, fatigue and unexpected weight change.  Respiratory: Negative for cough and shortness of breath.   Cardiovascular: Negative for chest pain and palpitations.  Psychiatric/Behavioral: Negative for behavioral problems and dysphoric mood. The patient is not nervous/anxious.     History Past Medical History  Diagnosis Date  . Depression     She has no past surgical history on file.   Her family history includes Bipolar disorder in her sister; Depression in an other family member.She reports that she has never smoked. She does not have any smokeless tobacco history on file. She reports that she does not drink alcohol or use illicit drugs.  Current Outpatient Prescriptions on File Prior to Visit  Medication Sig Dispense Refill  . buPROPion (WELLBUTRIN XL) 150 MG 24 hr tablet 2 tab by mouth in the morning-- 180 tablet 3  . clonazePAM (KLONOPIN) 1 MG tablet Take 1 tablet (1 mg total) by mouth 2 (two) times daily as needed. 60 tablet 0   No current facility-administered medications on file prior to visit.     Objective:  Objective Physical Exam  Constitutional: She is oriented to person, place, and time. She appears well-developed and well-nourished.  HENT:  Head: Normocephalic and atraumatic.  Eyes: Conjunctivae and EOM are normal.  Neck: Normal range of motion. Neck supple. No JVD present. Carotid bruit is not present. No thyromegaly present.  Cardiovascular: Normal rate, regular rhythm and normal heart sounds.   No murmur heard. Pulmonary/Chest: Effort normal and breath sounds normal. No respiratory distress. She has no wheezes. She has no rales. She exhibits no tenderness.  Musculoskeletal: She exhibits no edema.    Neurological: She is alert and oriented to person, place, and time.  Psychiatric: She has a normal mood and affect.   BP 118/76 mmHg  Pulse 70  Temp(Src) 99 F (37.2 C) (Oral)  Wt 197 lb (89.359 kg)  SpO2 98%  LMP 12/22/2014 Wt Readings from Last 3 Encounters:  01/22/15 197 lb (89.359 kg)  09/11/14 209 lb 12.8 oz (95.165 kg)  02/05/14 209 lb (94.802 kg)     Lab Results  Component Value Date   WBC 7.4 03/30/2013   HGB 12.9 03/30/2013   HCT 37.2 03/30/2013   PLT 320.0 03/30/2013   GLUCOSE 108* 03/30/2013   ALT 23 03/30/2013   AST 20 03/30/2013   NA 142 03/30/2013   K 3.7 03/30/2013   CL 99 03/30/2013   CREATININE 0.9 03/30/2013   BUN 16 03/30/2013   CO2 25 03/30/2013   TSH 4.401 05/08/2011    Dg Chest 2 View  03/30/2013   *RADIOLOGY REPORT*  Clinical Data: Shortness of breath.  Chest tightness.  CHEST - 2 VIEW  Comparison:  None.  Findings:  The heart size and mediastinal contours are within normal limits.  Both lungs are clear.  The visualized skeletal structures are unremarkable.  IMPRESSION: No active cardiopulmonary disease.   Original Report Authenticated By: Myles RosenthalJohn Stahl, M.D.     Assessment & Plan:  Plan I have discontinued Ms. Martorano's traMADol and HYDROcodone-acetaminophen. I am also having her start on amphetamine-dextroamphetamine, amphetamine-dextroamphetamine, and clindamycin. Additionally, I am having her maintain her buPROPion, clonazePAM, sertraline, and amphetamine-dextroamphetamine.  Meds ordered this encounter  Medications  . sertraline (  ZOLOFT) 100 MG tablet    Sig: TAKE 2 TABLETS BY MOUTH DAILY-    Dispense:  180 tablet    Refill:  3  . amphetamine-dextroamphetamine (ADDERALL XR) 30 MG 24 hr capsule    Sig: 2 po qd    Dispense:  60 capsule    Refill:  0  . amphetamine-dextroamphetamine (ADDERALL XR) 30 MG 24 hr capsule    Sig: 2 po qam    Dispense:  60 capsule    Refill:  0    Do not fill until March 09, 2015  .  amphetamine-dextroamphetamine (ADDERALL XR) 30 MG 24 hr capsule    Sig: 2 po qd    Dispense:  60 capsule    Refill:  0    Do not fill until April 08, 2015  . clindamycin (CLEOCIN T) 1 % lotion    Sig: Apply topically 2 (two) times daily.    Dispense:  60 mL    Refill:  5    Problem List Items Addressed This Visit    DEPRESSION/ANXIETY   Relevant Medications   sertraline (ZOLOFT) 100 MG tablet   ADD (attention deficit disorder)   Relevant Medications   amphetamine-dextroamphetamine (ADDERALL XR) 30 MG 24 hr capsule   amphetamine-dextroamphetamine (ADDERALL XR) 30 MG 24 hr capsule   amphetamine-dextroamphetamine (ADDERALL XR) 30 MG 24 hr capsule    Other Visit Diagnoses    Encounter for screening mammogram for breast cancer    -  Primary    Relevant Orders    MM Digital Screening    Acne vulgaris        Relevant Medications    clindamycin (CLEOCIN T) 1 % lotion       Follow-up: Return in about 6 months (around 07/25/2015), or if symptoms worsen or fail to improve, for f/u--- keep cpe.  Loreen FreudYvonne Lowne, DO

## 2015-03-12 ENCOUNTER — Telehealth: Payer: Self-pay | Admitting: Family Medicine

## 2015-03-12 ENCOUNTER — Other Ambulatory Visit: Payer: Self-pay | Admitting: Family Medicine

## 2015-03-13 ENCOUNTER — Encounter: Payer: Self-pay | Admitting: Family Medicine

## 2015-03-13 MED ORDER — CLONAZEPAM 1 MG PO TABS: 1.0000 mg | ORAL_TABLET | Freq: Two times a day (BID) | ORAL | Status: DC | PRN

## 2015-03-13 NOTE — Telephone Encounter (Signed)
Last seen 01/22/15 and filled 01/21/15 #60 UDS 12/07/12 low risk   Please advise    KP

## 2015-03-13 NOTE — Telephone Encounter (Signed)
Patient called in regarding this. Stating that her pharmacy is told her that they have not received this as of yet

## 2015-03-14 ENCOUNTER — Ambulatory Visit: Payer: Managed Care, Other (non HMO) | Admitting: Family Medicine

## 2015-03-14 MED ORDER — CLONAZEPAM 1 MG PO TABS: 1.0000 mg | ORAL_TABLET | Freq: Two times a day (BID) | ORAL | Status: DC | PRN

## 2015-03-14 NOTE — Addendum Note (Signed)
Addended by: Arnette Norris on: 03/14/2015 08:08 AM   Modules accepted: Orders

## 2015-03-27 ENCOUNTER — Other Ambulatory Visit: Payer: Self-pay

## 2015-03-27 DIAGNOSIS — F32A Depression, unspecified: Secondary | ICD-10-CM

## 2015-03-27 DIAGNOSIS — F329 Major depressive disorder, single episode, unspecified: Secondary | ICD-10-CM

## 2015-03-27 MED ORDER — BUPROPION HCL ER (XL) 150 MG PO TB24: ORAL_TABLET | ORAL | Status: DC

## 2015-04-08 ENCOUNTER — Other Ambulatory Visit: Payer: Self-pay | Admitting: Family Medicine

## 2015-04-08 MED ORDER — CLONAZEPAM 1 MG PO TABS: 1.0000 mg | ORAL_TABLET | Freq: Two times a day (BID) | ORAL | Status: DC | PRN

## 2015-04-08 NOTE — Telephone Encounter (Signed)
Last seen 01/22/15 and filled 03/14/15 #60  Please advise      KP

## 2015-04-09 ENCOUNTER — Encounter: Payer: Self-pay | Admitting: Family Medicine

## 2015-04-25 ENCOUNTER — Ambulatory Visit (HOSPITAL_BASED_OUTPATIENT_CLINIC_OR_DEPARTMENT_OTHER): Payer: Managed Care, Other (non HMO)

## 2015-04-26 ENCOUNTER — Ambulatory Visit (HOSPITAL_BASED_OUTPATIENT_CLINIC_OR_DEPARTMENT_OTHER): Payer: Managed Care, Other (non HMO)

## 2015-05-07 ENCOUNTER — Other Ambulatory Visit: Payer: Self-pay | Admitting: Family Medicine

## 2015-05-08 ENCOUNTER — Other Ambulatory Visit: Payer: Self-pay | Admitting: Family Medicine

## 2015-05-08 DIAGNOSIS — F988 Other specified behavioral and emotional disorders with onset usually occurring in childhood and adolescence: Secondary | ICD-10-CM

## 2015-05-09 MED ORDER — CLONAZEPAM 1 MG PO TABS: 1.0000 mg | ORAL_TABLET | Freq: Two times a day (BID) | ORAL | Status: DC | PRN

## 2015-05-09 MED ORDER — AMPHETAMINE-DEXTROAMPHET ER 30 MG PO CP24: ORAL_CAPSULE | ORAL | Status: DC

## 2015-05-09 MED ORDER — AMPHETAMINE-DEXTROAMPHET ER 30 MG PO CP24
ORAL_CAPSULE | ORAL | Status: DC
Start: 2015-05-09 — End: 2015-08-08

## 2015-05-09 NOTE — Telephone Encounter (Signed)
VM left advising Med's are ready for pick up.      KP

## 2015-05-09 NOTE — Telephone Encounter (Signed)
Last seen 01/22/15 and filled 04/08/15 #30 12/07/12 low risk   Please advise     KP

## 2015-05-09 NOTE — Telephone Encounter (Signed)
Last seen 01/22/15 and filled 04/08/15 #60 UDS 12/07/12 Low risk   Please advise     KP

## 2015-06-07 ENCOUNTER — Encounter: Payer: Self-pay | Admitting: Family Medicine

## 2015-06-07 ENCOUNTER — Other Ambulatory Visit: Payer: Self-pay | Admitting: Family Medicine

## 2015-06-07 MED ORDER — CLONAZEPAM 1 MG PO TABS: 1.0000 mg | ORAL_TABLET | Freq: Two times a day (BID) | ORAL | Status: DC | PRN

## 2015-06-07 NOTE — Telephone Encounter (Signed)
Klonopin requested Last seen 01/22/15 and filled 05/09/15 #60  UDS 05/08/15 low risk   Please advise      KP

## 2015-07-09 ENCOUNTER — Other Ambulatory Visit: Payer: Self-pay | Admitting: Family

## 2015-07-10 ENCOUNTER — Encounter: Payer: Self-pay | Admitting: Family Medicine

## 2015-07-10 ENCOUNTER — Other Ambulatory Visit: Payer: Self-pay | Admitting: Family

## 2015-07-10 NOTE — Telephone Encounter (Signed)
Request for refill on: Clonazepam 1 mg Last Rx: 08.18.16, #60x0 Last OV: 05.03.16 Return: 6-Mths. Please advise on refills/SLS

## 2015-07-10 NOTE — Telephone Encounter (Signed)
See 07/09/15 refill request.

## 2015-07-18 ENCOUNTER — Other Ambulatory Visit: Payer: Self-pay | Admitting: Family

## 2015-07-19 NOTE — Telephone Encounter (Signed)
I called this in yesterday.    KP

## 2015-07-31 ENCOUNTER — Telehealth: Payer: Self-pay | Admitting: Behavioral Health

## 2015-07-31 NOTE — Telephone Encounter (Signed)
Unable to reach patient at time of Pre-Visit Call.  Left message for patient to return call when available.    

## 2015-08-01 ENCOUNTER — Encounter: Payer: Managed Care, Other (non HMO) | Admitting: Family Medicine

## 2015-08-01 DIAGNOSIS — Z0289 Encounter for other administrative examinations: Secondary | ICD-10-CM

## 2015-08-07 ENCOUNTER — Telehealth: Payer: Self-pay | Admitting: Family Medicine

## 2015-08-07 NOTE — Telephone Encounter (Signed)
Pt was no show for cpe 08/01/15 9:30am, pt has not rescheduled, charge or no charge?

## 2015-08-08 ENCOUNTER — Other Ambulatory Visit: Payer: Self-pay | Admitting: Family Medicine

## 2015-08-08 DIAGNOSIS — F988 Other specified behavioral and emotional disorders with onset usually occurring in childhood and adolescence: Secondary | ICD-10-CM

## 2015-08-08 MED ORDER — AMPHETAMINE-DEXTROAMPHET ER 30 MG PO CP24: ORAL_CAPSULE | ORAL | Status: DC

## 2015-08-08 MED ORDER — CLONAZEPAM 1 MG PO TABS: 1.0000 mg | ORAL_TABLET | Freq: Two times a day (BID) | ORAL | Status: DC | PRN

## 2015-08-08 NOTE — Telephone Encounter (Signed)
charge 

## 2015-08-08 NOTE — Telephone Encounter (Signed)
Last seen 01/2015 and  filled 07/10/15 please advise      KP

## 2015-08-09 ENCOUNTER — Other Ambulatory Visit: Payer: Self-pay

## 2015-08-09 ENCOUNTER — Encounter: Payer: Self-pay | Admitting: Family Medicine

## 2015-08-09 DIAGNOSIS — F988 Other specified behavioral and emotional disorders with onset usually occurring in childhood and adolescence: Secondary | ICD-10-CM

## 2015-08-09 MED ORDER — CLONAZEPAM 1 MG PO TABS: 1.0000 mg | ORAL_TABLET | Freq: Two times a day (BID) | ORAL | Status: DC | PRN

## 2015-08-09 MED ORDER — AMPHETAMINE-DEXTROAMPHET ER 30 MG PO CP24: ORAL_CAPSULE | ORAL | Status: DC

## 2015-08-09 NOTE — Addendum Note (Signed)
Addended by: Arnette NorrisPAYNE, Clydie Dillen P on: 08/09/2015 09:08 AM   Modules accepted: Orders

## 2015-09-09 ENCOUNTER — Other Ambulatory Visit: Payer: Self-pay | Admitting: Family Medicine

## 2015-09-09 DIAGNOSIS — F988 Other specified behavioral and emotional disorders with onset usually occurring in childhood and adolescence: Secondary | ICD-10-CM

## 2015-09-09 MED ORDER — AMPHETAMINE-DEXTROAMPHET ER 30 MG PO CP24: ORAL_CAPSULE | ORAL | Status: DC

## 2015-09-17 ENCOUNTER — Ambulatory Visit: Payer: Managed Care, Other (non HMO) | Admitting: Family Medicine

## 2015-09-18 ENCOUNTER — Encounter: Payer: Self-pay | Admitting: Family Medicine

## 2015-09-27 ENCOUNTER — Encounter: Payer: Self-pay | Admitting: Family Medicine

## 2015-09-27 ENCOUNTER — Telehealth: Payer: Self-pay | Admitting: Family Medicine

## 2015-09-27 DIAGNOSIS — F988 Other specified behavioral and emotional disorders with onset usually occurring in childhood and adolescence: Secondary | ICD-10-CM

## 2015-09-27 NOTE — Telephone Encounter (Signed)
Patient aware and she verbalized understanding.     KP 

## 2015-09-27 NOTE — Telephone Encounter (Signed)
Unfortunately we can not fill rx because it is a controlled substance.  Pharmacy may let her fill 7 days early.

## 2015-09-27 NOTE — Telephone Encounter (Signed)
Please advise      KP 
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
## 2015-09-27 NOTE — Telephone Encounter (Signed)
Patient calling back checking on the status of below message best [phone], advised patient you are in between patient and will follow up

## 2015-10-04 MED ORDER — AMPHETAMINE-DEXTROAMPHET ER 30 MG PO CP24: ORAL_CAPSULE | ORAL | Status: DC

## 2015-10-04 NOTE — Telephone Encounter (Signed)
If its due on 17 or 18 they may fill--- we can give her rx

## 2015-10-07 ENCOUNTER — Ambulatory Visit: Payer: Self-pay | Admitting: Family Medicine

## 2015-10-07 ENCOUNTER — Telehealth: Payer: Self-pay | Admitting: Family Medicine
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
## 2015-10-07 NOTE — Telephone Encounter (Signed)
Don't believe we heard anything from pharmacy

## 2015-10-07 NOTE — Telephone Encounter (Signed)
To MD for review also se previous my-chart emails in reference to her ADD med's    KP

## 2015-10-08 NOTE — Telephone Encounter (Signed)
We are not filling

## 2015-10-08 NOTE — Telephone Encounter (Signed)
No and the patient cancelled her apt.   KP

## 2015-10-11 ENCOUNTER — Encounter: Payer: Self-pay | Admitting: Family Medicine

## 2015-10-11 ENCOUNTER — Ambulatory Visit (INDEPENDENT_AMBULATORY_CARE_PROVIDER_SITE_OTHER): Payer: Managed Care, Other (non HMO) | Admitting: Family Medicine

## 2015-10-11 VITALS — BP 110/70 | HR 83 | Temp 98.5°F | Ht 62.0 in | Wt 218.7 lb

## 2015-10-11 DIAGNOSIS — F988 Other specified behavioral and emotional disorders with onset usually occurring in childhood and adolescence: Secondary | ICD-10-CM

## 2015-10-11 DIAGNOSIS — Z23 Encounter for immunization: Secondary | ICD-10-CM | POA: Diagnosis not present

## 2015-10-11 DIAGNOSIS — M79604 Pain in right leg: Secondary | ICD-10-CM

## 2015-10-11 DIAGNOSIS — M545 Low back pain, unspecified: Secondary | ICD-10-CM | POA: Insufficient documentation

## 2015-10-11 DIAGNOSIS — F909 Attention-deficit hyperactivity disorder, unspecified type: Secondary | ICD-10-CM

## 2015-10-11 MED ORDER — MELOXICAM 15 MG PO TABS: 15.0000 mg | ORAL_TABLET | Freq: Every day | ORAL | Status: DC

## 2015-10-11 MED ORDER — AMPHETAMINE-DEXTROAMPHET ER 30 MG PO CP24: ORAL_CAPSULE | ORAL | Status: DC

## 2015-10-11 MED ORDER — METHOCARBAMOL 500 MG PO TABS: ORAL_TABLET | ORAL | Status: DC

## 2015-10-11 NOTE — Progress Notes (Signed)
Patient ID: Veronica Yates, female    DOB: [birth year]  Age: 45 y.o. MRN: [id number]    Subjective:  Subjective HPI Veronica Yates presents for f/u ADD.  No problems there.  Her back has been worseing over the last few months but significantly so over last few weeks.  She saw Dr Noel Gerold in 2008 and MRI was done and she was told she had DDD.  No new injury.  She was given mobic , tramadol and muscle relaxer.    Review of Systems  Constitutional: Negative for diaphoresis, appetite change, fatigue and unexpected weight change.  Eyes: Negative for pain, redness and visual disturbance.  Respiratory: Negative for cough, chest tightness, shortness of breath and wheezing.   Cardiovascular: Negative for chest pain, palpitations and leg swelling.  Endocrine: Negative for cold intolerance, heat intolerance, polydipsia, polyphagia and polyuria.  Genitourinary: Negative for dysuria, frequency and difficulty urinating.  Musculoskeletal: Positive for back pain.  Neurological: Positive for numbness. Negative for dizziness, light-headedness and headaches.    History Past Medical History  Diagnosis Date  . Depression     She has no past surgical history on file.   Her family history includes Bipolar disorder in her sister.She reports that she has never smoked. She does not have any smokeless tobacco history on file. She reports that she does not drink alcohol or use illicit drugs.  Current Outpatient Prescriptions on File Prior to Visit  Medication Sig Dispense Refill  . buPROPion (WELLBUTRIN XL) 150 MG 24 hr tablet 2 tab by mouth in the morning-- 180 tablet 1  . clindamycin (CLEOCIN T) 1 % lotion Apply topically 2 (two) times daily. 60 mL 5  . clonazePAM (KLONOPIN) 1 MG tablet Take 1 tablet (1 mg total) by mouth 2 (two) times daily as needed. 60 tablet 0  . sertraline (ZOLOFT) 100 MG tablet TAKE 2 TABLETS BY MOUTH DAILY- 180 tablet 3   No current facility-administered medications on file prior to  visit.     Objective:  Objective Physical Exam  Constitutional: She is oriented to person, place, and time. She appears well-developed and well-nourished.  HENT:  Head: Normocephalic and atraumatic.  Eyes: Conjunctivae and EOM are normal.  Neck: Normal range of motion. Neck supple. No JVD present. Carotid bruit is not present. No thyromegaly present.  Cardiovascular: Normal rate, regular rhythm and normal heart sounds.   No murmur heard. Pulmonary/Chest: Effort normal and breath sounds normal. No respiratory distress. She has no wheezes. She has no rales. She exhibits no tenderness.  Musculoskeletal: She exhibits no edema.       Lumbar back: She exhibits pain.  Neurological: She is alert and oriented to person, place, and time. She has normal strength and normal reflexes. No sensory deficit.  Pain with straight leg raise but pt was able to go FROM b/l    Psychiatric: She has a normal mood and affect. Her behavior is normal.  Nursing note and vitals reviewed.  BP 110/70 mmHg  Pulse 83  Temp(Src) 98.5 F (36.9 C) (Oral)  Ht  (1.575 m)  Wt 218 lb 11.1 oz (99.2 kg)  BMI 39.99 kg/m2  SpO2 98% Wt Readings from Last 3 Encounters:  10/11/15 218 lb 11.1 oz (99.2 kg)  01/22/15 197 lb (89.359 kg)  09/11/14 209 lb 12.8 oz (95.165 kg)     Lab Results  Component Value Date   WBC 7.4 03/30/2013   HGB 12.9 03/30/2013   HCT 37.2 03/30/2013   PLT 320.0 03/30/2013  GLUCOSE 108* 03/30/2013   ALT 23 03/30/2013   AST 20 03/30/2013   NA 142 03/30/2013   K 3.7 03/30/2013   CL 99 03/30/2013   CREATININE 0.9 03/30/2013   BUN 16 03/30/2013   CO2 25 03/30/2013   TSH 4.401 05/08/2011    Dg Chest 2 View  03/30/2013  *RADIOLOGY REPORT* Clinical Data: Shortness of breath.  Chest tightness. CHEST - 2 VIEW Comparison:  None. Findings:  The heart size and mediastinal contours are within normal limits.  Both lungs are clear.  The visualized skeletal structures are unremarkable. IMPRESSION:  No active cardiopulmonary disease. Original Report Authenticated By: Myles Rosenthal, M.D.     Assessment & Plan:  Plan I am having [name] start on amphetamine-dextroamphetamine, amphetamine-dextroamphetamine, meloxicam, and methocarbamol. I am also having her maintain her sertraline, clindamycin, buPROPion, clonazePAM, and amphetamine-dextroamphetamine.  Meds ordered this encounter  Medications  . amphetamine-dextroamphetamine (ADDERALL XR) 30 MG 24 hr capsule    Sig: 2 po qd    Dispense:  60 capsule    Refill:  0    Do not fill until Feb, 14, 2017  . amphetamine-dextroamphetamine (ADDERALL XR) 30 MG 24 hr capsule    Sig: 2 po qam    Dispense:  60 capsule    Refill:  0    Do not fill until December 03, 2015  . amphetamine-dextroamphetamine (ADDERALL XR) 30 MG 24 hr capsule    Sig: 2 po qd    Dispense:  60 capsule    Refill:  0    Do not fill until January 03, 2016  . meloxicam (MOBIC) 15 MG tablet    Sig: Take 1 tablet (15 mg total) by mouth daily.    Dispense:  30 tablet    Refill:  2  . methocarbamol (ROBAXIN) 500 MG tablet    Sig: 1 -2 po tid prn    Dispense:  45 tablet    Refill:  1    Problem List Items Addressed This Visit      Unprioritized   Low back pain radiating to both legs    Rest Alt ice and heat mobic and robaxin rto prn       Relevant Medications   meloxicam (MOBIC) 15 MG tablet   methocarbamol (ROBAXIN) 500 MG tablet   ADD (attention deficit disorder)   Relevant Medications   amphetamine-dextroamphetamine (ADDERALL XR) 30 MG 24 hr capsule   amphetamine-dextroamphetamine (ADDERALL XR) 30 MG 24 hr capsule   amphetamine-dextroamphetamine (ADDERALL XR) 30 MG 24 hr capsule    Other Visit Diagnoses    Need for immunization against influenza    -  Primary    Relevant Orders    Flu Vaccine QUAD 36+ mos IM (Fluarix) (Completed)       Follow-up: Return in about 2 months (around 12/09/2015), or if symptoms worsen or fail to improve, for add , back  pain.  Loreen Freud, DO

## 2015-10-11 NOTE — Progress Notes (Signed)
Pre visit review using our clinic review tool, if applicable. No additional management support is needed unless otherwise documented below in the visit note. 

## 2015-10-11 NOTE — Patient Instructions (Signed)

## 2015-10-11 NOTE — Assessment & Plan Note (Signed)
Rest Alt ice and heat mobic and robaxin rto prn

## 2015-10-24 ENCOUNTER — Other Ambulatory Visit: Payer: Self-pay | Admitting: Family Medicine

## 2015-10-25 NOTE — Telephone Encounter (Signed)
Last seen 10/11/15 and filled 08/08/15 #60  Please advise   KP

## 2015-10-28 ENCOUNTER — Encounter: Payer: Self-pay | Admitting: Family Medicine

## 2015-10-28 MED ORDER — HYDROCODONE-ACETAMINOPHEN 5-325 MG PO TABS: 1.0000 | ORAL_TABLET | Freq: Four times a day (QID) | ORAL | Status: DC | PRN

## 2015-10-28 NOTE — Telephone Encounter (Signed)
Message sent advising Rx ready for pick up.   KP

## 2015-10-28 NOTE — Telephone Encounter (Signed)
vicodin 5/325 #45  1 po q6h prn

## 2015-11-29 ENCOUNTER — Other Ambulatory Visit: Payer: Self-pay | Admitting: Family Medicine

## 2015-12-16 ENCOUNTER — Encounter: Payer: Self-pay | Admitting: Family Medicine

## 2015-12-16 ENCOUNTER — Ambulatory Visit (INDEPENDENT_AMBULATORY_CARE_PROVIDER_SITE_OTHER): Payer: Managed Care, Other (non HMO) | Admitting: Family Medicine

## 2015-12-16 VITALS — BP 110/74 | HR 73 | Temp 98.0°F | Ht 62.0 in | Wt 215.4 lb

## 2015-12-16 DIAGNOSIS — M5441 Lumbago with sciatica, right side: Secondary | ICD-10-CM | POA: Diagnosis not present

## 2015-12-16 DIAGNOSIS — G4700 Insomnia, unspecified: Secondary | ICD-10-CM

## 2015-12-16 DIAGNOSIS — N951 Menopausal and female climacteric states: Secondary | ICD-10-CM | POA: Diagnosis not present

## 2015-12-16 DIAGNOSIS — F909 Attention-deficit hyperactivity disorder, unspecified type: Secondary | ICD-10-CM

## 2015-12-16 DIAGNOSIS — R5382 Chronic fatigue, unspecified: Secondary | ICD-10-CM | POA: Diagnosis not present

## 2015-12-16 DIAGNOSIS — F988 Other specified behavioral and emotional disorders with onset usually occurring in childhood and adolescence: Secondary | ICD-10-CM

## 2015-12-16 LAB — CBC WITH DIFFERENTIAL/PLATELET
BASOS PCT: 0.5 % (ref 0.0–3.0)
Basophils Absolute: 0 10*3/uL (ref 0.0–0.1)
EOS PCT: 0.8 % (ref 0.0–5.0)
Eosinophils Absolute: 0 10*3/uL (ref 0.0–0.7)
HEMATOCRIT: 34.6 % — AB (ref 36.0–46.0)
Hemoglobin: 11.8 g/dL — ABNORMAL LOW (ref 12.0–15.0)
LYMPHS ABS: 1.8 10*3/uL (ref 0.7–4.0)
LYMPHS PCT: 29.5 % (ref 12.0–46.0)
MCHC: 34 g/dL (ref 30.0–36.0)
MCV: 85.8 fl (ref 78.0–100.0)
MONOS PCT: 8.1 % (ref 3.0–12.0)
Monocytes Absolute: 0.5 10*3/uL (ref 0.1–1.0)
NEUTROS ABS: 3.6 10*3/uL (ref 1.4–7.7)
NEUTROS PCT: 61.1 % (ref 43.0–77.0)
PLATELETS: 296 10*3/uL (ref 150.0–400.0)
RBC: 4.03 Mil/uL (ref 3.87–5.11)
RDW: 14.1 % (ref 11.5–15.5)
WBC: 6 10*3/uL (ref 4.0–10.5)

## 2015-12-16 LAB — VITAMIN B12: Vitamin B-12: 678 pg/mL (ref 211–911)

## 2015-12-16 LAB — FOLLICLE STIMULATING HORMONE: FSH: 4.8 m[IU]/mL

## 2015-12-16 LAB — LUTEINIZING HORMONE: LH: 1.92 m[IU]/mL

## 2015-12-16 LAB — TSH: TSH: 2.45 u[IU]/mL (ref 0.35–4.50)

## 2015-12-16 MED ORDER — AMPHETAMINE-DEXTROAMPHET ER 30 MG PO CP24: ORAL_CAPSULE | ORAL | Status: DC

## 2015-12-16 MED ORDER — HYDROCODONE-ACETAMINOPHEN 5-325 MG PO TABS: 1.0000 | ORAL_TABLET | Freq: Four times a day (QID) | ORAL | Status: DC | PRN

## 2015-12-16 MED ORDER — SUVOREXANT 10 MG PO TABS: 1.0000 | ORAL_TABLET | Freq: Every evening | ORAL | Status: DC | PRN

## 2015-12-16 NOTE — Patient Instructions (Signed)
Attention Deficit Hyperactivity Disorder  Attention deficit hyperactivity disorder (ADHD) is a problem with behavior issues based on the way the brain functions (neurobehavioral disorder). It is a common reason for behavior and academic problems in school.  SYMPTOMS   There are 3 types of ADHD. The 3 types and some of the symptoms include:  · Inattentive.    Gets bored or distracted easily.    Loses or forgets things. Forgets to hand in homework.    Has trouble organizing or completing tasks.    Difficulty staying on task.    An inability to organize daily tasks and school work.    Leaving projects, chores, or homework unfinished.    Trouble paying attention or responding to details. Careless mistakes.    Difficulty following directions. Often seems like is not listening.    Dislikes activities that require sustained attention (like chores or homework).  · Hyperactive-impulsive.    Feels like it is impossible to sit still or stay in a seat. Fidgeting with hands and feet.    Trouble waiting turn.    Talking too much or out of turn. Interruptive.    Speaks or acts impulsively.    Aggressive, disruptive behavior.    Constantly busy or on the go; noisy.    Often leaves seat when they are expected to remain seated.    Often runs or climbs where it is not appropriate, or feels very restless.  · Combined.    Has symptoms of both of the above.  Often children with ADHD feel discouraged about themselves and with school. They often perform well below their abilities in school.  As children get older, the excess motor activities can calm down, but the problems with paying attention and staying organized persist. Most children do not outgrow ADHD but with good treatment can learn to cope with the symptoms.  DIAGNOSIS   When ADHD is suspected, the diagnosis should be made by professionals trained in ADHD. This professional will collect information about the individual suspected of having ADHD. Information must be collected from  various settings where the person lives, works, or attends school.    Diagnosis will include:  · Confirming symptoms began in childhood.  · Ruling out other reasons for the child's behavior.  · The health care providers will check with the child's school and check their medical records.  · They will talk to teachers and parents.  · Behavior rating scales for the child will be filled out by those dealing with the child on a daily basis.  A diagnosis is made only after all information has been considered.  TREATMENT   Treatment usually includes behavioral treatment, tutoring or extra support in school, and stimulant medicines. Because of the way a person's brain works with ADHD, these medicines decrease impulsivity and hyperactivity and increase attention. This is different than how they would work in a person who does not have ADHD. Other medicines used include antidepressants and certain blood pressure medicines.  Most experts agree that treatment for ADHD should address all aspects of the person's functioning. Along with medicines, treatment should include structured classroom management at school. Parents should reward good behavior, provide constant discipline, and set limits. Tutoring should be available for the child as needed.  ADHD is a lifelong condition. If untreated, the disorder can have long-term serious effects into adolescence and adulthood.  HOME CARE INSTRUCTIONS   · Often with ADHD there is a lot of frustration among family members dealing with the condition. Blame   and anger are also feelings that are common. In many cases, because the problem affects the family as a whole, the entire family may need help. A therapist can help the family find better ways to handle the disruptive behaviors of the person with ADHD and promote change. If the person with ADHD is young, most of the therapist's work is with the parents. Parents will learn techniques for coping with and improving their child's behavior.  Sometimes only the child with the ADHD needs counseling. Your health care providers can help you make these decisions.  · Children with ADHD may need help learning how to organize. Some helpful tips include:  ¨ Keep routines the same every day from wake-up time to bedtime. Schedule all activities, including homework and playtime. Keep the schedule in a place where the person with ADHD will often see it. Mark schedule changes as far in advance as possible.  ¨ Schedule outdoor and indoor recreation.  ¨ Have a place for everything and keep everything in its place. This includes clothing, backpacks, and school supplies.  ¨ Encourage writing down assignments and bringing home needed books. Work with your child's teachers for assistance in organizing school work.  · Offer your child a well-balanced diet. Breakfast that includes a balance of whole grains, protein, and fruits or vegetables is especially important for school performance. Children should avoid drinks with caffeine including:  ¨ Soft drinks.  ¨ Coffee.  ¨ Tea.  ¨ However, some older children (adolescents) may find these drinks helpful in improving their attention. Because it can also be common for adolescents with ADHD to become addicted to caffeine, talk with your health care provider about what is a safe amount of caffeine intake for your child.  · Children with ADHD need consistent rules that they can understand and follow. If rules are followed, give small rewards. Children with ADHD often receive, and expect, criticism. Look for good behavior and praise it. Set realistic goals. Give clear instructions. Look for activities that can foster success and self-esteem. Make time for pleasant activities with your child. Give lots of affection.  · Parents are their children's greatest advocates. Learn as much as possible about ADHD. This helps you become a stronger and better advocate for your child. It also helps you educate your child's teachers and instructors  if they feel inadequate in these areas. Parent support groups are often helpful. A national group with local chapters is called Children and Adults with Attention Deficit Hyperactivity Disorder (CHADD).  SEEK MEDICAL CARE IF:  · Your child has repeated muscle twitches, cough, or speech outbursts.  · Your child has sleep problems.  · Your child has a marked loss of appetite.  · Your child develops depression.  · Your child has new or worsening behavioral problems.  · Your child develops dizziness.  · Your child has a racing heart.  · Your child has stomach pains.  · Your child develops headaches.  SEEK IMMEDIATE MEDICAL CARE IF:  · Your child has been diagnosed with depression or anxiety and the symptoms seem to be getting worse.  · Your child has been depressed and suddenly appears to have increased energy or motivation.  · You are worried that your child is having a bad reaction to a medication he or she is taking for ADHD.     This information is not intended to replace advice given to you by your health care provider. Make sure you discuss any questions you have with your   health care provider.     Document Released: 08/28/2002 Document Revised: 09/12/2013 Document Reviewed: 05/15/2013  Elsevier Interactive Patient Education ©2016 [employer]

## 2015-12-16 NOTE — Progress Notes (Signed)
Pre visit review using our clinic review tool, if applicable. No additional management support is needed unless otherwise documented below in the visit note. 

## 2015-12-16 NOTE — Progress Notes (Signed)
Patient ID: Veronica Yates, female    DOB: [birth year]  Age: 45 y.o. MRN: [id number]    Subjective:  Subjective HPI Veronica Yates presents for f/u adhd.  Pt is doing well with meds but is struggling still with her back pain.   She needs to make an appointment to see Dr Patrice Paradise.   She is also c/o about trouble sleeping.  She is asking for her hormones to be checked.  Her husband has told her she snores loudly.  She has never had a sleep study.    Review of Systems  Constitutional: Negative for diaphoresis, appetite change, fatigue and unexpected weight change.  Eyes: Negative for pain, redness and visual disturbance.  Respiratory: Negative for cough, chest tightness, shortness of breath and wheezing.   Cardiovascular: Negative for chest pain, palpitations and leg swelling.  Endocrine: Negative for cold intolerance, heat intolerance, polydipsia, polyphagia and polyuria.  Genitourinary: Negative for dysuria, frequency and difficulty urinating.  Neurological: Negative for dizziness, light-headedness, numbness and headaches.    History Past Medical History  Diagnosis Date  . Depression     She has no past surgical history on file.   Her family history includes Bipolar disorder in her sister.She reports that she has never smoked. She does not have any smokeless tobacco history on file. She reports that she does not drink alcohol or use illicit drugs.  Current Outpatient Prescriptions on File Prior to Visit  Medication Sig Dispense Refill  . buPROPion (WELLBUTRIN XL) 150 MG 24 hr tablet TAKE 2 TABLETS BY MOUTH EVERY MORNING 180 tablet 1  . clindamycin (CLEOCIN T) 1 % lotion APPLY TOPICALLY 2 TIMES DAILY. 60 mL 5  . clonazePAM (KLONOPIN) 1 MG tablet TAKE 1 TABLET BY MOUTH TWICE A DAY AS NEEDED 60 tablet 1  . meloxicam (MOBIC) 15 MG tablet Take 1 tablet (15 mg total) by mouth daily. 30 tablet 2  . methocarbamol (ROBAXIN) 500 MG tablet 1 -2 po tid prn 45 tablet 1  . sertraline (ZOLOFT) 100  MG tablet TAKE 2 TABLETS BY MOUTH DAILY- 180 tablet 3   No current facility-administered medications on file prior to visit.     Objective:  Objective Physical Exam  Constitutional: She is oriented to person, place, and time. She appears well-developed and well-nourished.  HENT:  Head: Normocephalic and atraumatic.  Eyes: Conjunctivae and EOM are normal.  Neck: Normal range of motion. Neck supple. No JVD present. Carotid bruit is not present. No thyromegaly present.  Cardiovascular: Normal rate, regular rhythm and normal heart sounds.   No murmur heard. Pulmonary/Chest: Effort normal and breath sounds normal. No respiratory distress. She has no wheezes. She has no rales. She exhibits no tenderness.  Musculoskeletal: She exhibits no edema.  Neurological: She is alert and oriented to person, place, and time.  Psychiatric: She has a normal mood and affect. Her behavior is normal.  Nursing note and vitals reviewed.  BP 110/74 mmHg  Pulse 73  Temp(Src) 98 F (36.7 C) (Oral)  Ht 5' 2"  (1.575 m)  Wt 215 lb 6.4 oz (97.705 kg)  BMI 39.39 kg/m2  SpO2 98%  LMP 12/14/2015 Wt Readings from Last 3 Encounters:  12/16/15 215 lb 6.4 oz (97.705 kg)  10/11/15 218 lb 11.1 oz (99.2 kg)  01/22/15 197 lb (89.359 kg)     Lab Results  Component Value Date   WBC 6.0 12/16/2015   HGB 11.8* 12/16/2015   HCT 34.6* 12/16/2015   PLT 296.0 12/16/2015   GLUCOSE 108* 03/30/2013  ALT 23 03/30/2013   AST 20 03/30/2013   NA 142 03/30/2013   K 3.7 03/30/2013   CL 99 03/30/2013   CREATININE 0.9 03/30/2013   BUN 16 03/30/2013   CO2 25 03/30/2013   TSH 2.45 12/16/2015    Dg Chest 2 View  03/30/2013  *RADIOLOGY REPORT* Clinical Data: Shortness of breath.  Chest tightness. CHEST - 2 VIEW Comparison:  None. Findings:  The heart size and mediastinal contours are within normal limits.  Both lungs are clear.  The visualized skeletal structures are unremarkable. IMPRESSION: No active cardiopulmonary  disease. Original Report Authenticated By: Earle Gell, M.D.     Assessment & Plan:  Plan I am having Veronica Yates start on Suvorexant. I am also having her maintain her sertraline, meloxicam, methocarbamol, buPROPion, clonazePAM, clindamycin, amphetamine-dextroamphetamine, amphetamine-dextroamphetamine, amphetamine-dextroamphetamine, and HYDROcodone-acetaminophen.  Meds ordered this encounter  Medications  . amphetamine-dextroamphetamine (ADDERALL XR) 30 MG 24 hr capsule    Sig: 2 po qd    Dispense:  60 capsule    Refill:  0    Do not fill until April 03, 2016  . amphetamine-dextroamphetamine (ADDERALL XR) 30 MG 24 hr capsule    Sig: 2 po qam    Dispense:  60 capsule    Refill:  0    Do not fill until March 04, 2016  . amphetamine-dextroamphetamine (ADDERALL XR) 30 MG 24 hr capsule    Sig: 2 po qd    Dispense:  60 capsule    Refill:  0    Do not fill until May, 14, 2017  . HYDROcodone-acetaminophen (NORCO/VICODIN) 5-325 MG tablet    Sig: Take 1 tablet by mouth every 6 (six) hours as needed for moderate pain.    Dispense:  45 tablet    Refill:  0  . Suvorexant (BELSOMRA) 10 MG TABS    Sig: Take 1 tablet by mouth at bedtime as needed.    Dispense:  10 tablet    Refill:  0    Problem List Items Addressed This Visit      Unprioritized   ADD (attention deficit disorder) - Primary   Relevant Medications   amphetamine-dextroamphetamine (ADDERALL XR) 30 MG 24 hr capsule   amphetamine-dextroamphetamine (ADDERALL XR) 30 MG 24 hr capsule   amphetamine-dextroamphetamine (ADDERALL XR) 30 MG 24 hr capsule   Insomnia    Home sleep study kit given to pt to take home for 3 days       Relevant Medications   Suvorexant (BELSOMRA) 10 MG TABS    Other Visit Diagnoses    Right-sided low back pain with right-sided sciatica        Relevant Medications    HYDROcodone-acetaminophen (NORCO/VICODIN) 5-325 MG tablet    Chronic fatigue        Relevant Orders    CMP14+CBC/D/Plt+TSH    CBC with  Differential/Platelet (Completed)    TSH (Completed)    Vitamin B12 (Completed)    Vitamin D 1,25 dihydroxy    Perimenopausal        Relevant Orders    Estrogens, Total    Follicle Stimulating Hormone (Completed)    Luteinizing hormone (Completed)       Follow-up: Return in about 6 months (around 06/17/2016), or if symptoms worsen or fail to improve, for adhd.  Garnet Koyanagi, DO

## 2015-12-16 NOTE — Assessment & Plan Note (Signed)
Doing well con't meds  rto 6 months

## 2015-12-16 NOTE — Assessment & Plan Note (Signed)
Home sleep study kit given to pt to take home for 3 days

## 2015-12-19 LAB — VITAMIN D 1,25 DIHYDROXY
VITAMIN D 1, 25 (OH) TOTAL: 57 pg/mL (ref 18–72)
VITAMIN D3 1, 25 (OH): 57 pg/mL

## 2015-12-23 LAB — ESTROGENS, TOTAL: Estrogen: 139.1 pg/mL

## 2016-01-06 ENCOUNTER — Encounter: Payer: Self-pay | Admitting: Family Medicine

## 2016-01-08 ENCOUNTER — Encounter: Payer: Self-pay | Admitting: Family Medicine

## 2016-01-14 ENCOUNTER — Other Ambulatory Visit: Payer: Self-pay | Admitting: Family Medicine

## 2016-01-14 NOTE — Telephone Encounter (Signed)
Last seen 12/16/15 and filled 10/25/15 #60 with 1    Please advise     KP

## 2016-01-22 ENCOUNTER — Encounter: Payer: Self-pay | Admitting: Family Medicine

## 2016-01-22 NOTE — Telephone Encounter (Signed)
lunesta 2 mg #30  1 po qhs prn but I really do not want her to take it everyday--- it is addictive

## 2016-01-23 MED ORDER — ESZOPICLONE 2 MG PO TABS: 2.0000 mg | ORAL_TABLET | Freq: Every evening | ORAL | Status: DC | PRN

## 2016-01-27 ENCOUNTER — Other Ambulatory Visit: Payer: Self-pay | Admitting: Family Medicine

## 2016-01-27 DIAGNOSIS — M5441 Lumbago with sciatica, right side: Secondary | ICD-10-CM

## 2016-01-27 MED ORDER — HYDROCODONE-ACETAMINOPHEN 5-325 MG PO TABS: 1.0000 | ORAL_TABLET | Freq: Four times a day (QID) | ORAL | Status: DC | PRN

## 2016-01-27 NOTE — Telephone Encounter (Signed)
Last seen and filled 12/16/15 #45 UDS 05/08/15 was Neg for all med's   Please advise    KP

## 2016-01-28 ENCOUNTER — Encounter: Payer: Self-pay | Admitting: Family Medicine

## 2016-01-28 ENCOUNTER — Other Ambulatory Visit: Payer: Self-pay

## 2016-01-28 ENCOUNTER — Ambulatory Visit (INDEPENDENT_AMBULATORY_CARE_PROVIDER_SITE_OTHER): Payer: Managed Care, Other (non HMO) | Admitting: Family Medicine

## 2016-01-28 VITALS — BP 111/76 | HR 86 | Temp 98.5°F | Wt 215.8 lb

## 2016-01-28 DIAGNOSIS — J029 Acute pharyngitis, unspecified: Secondary | ICD-10-CM

## 2016-01-28 DIAGNOSIS — J302 Other seasonal allergic rhinitis: Secondary | ICD-10-CM

## 2016-01-28 LAB — POCT RAPID STREP A (OFFICE): Rapid Strep A Screen: NEGATIVE

## 2016-01-28 MED ORDER — LEVOCETIRIZINE DIHYDROCHLORIDE 5 MG PO TABS: 5.0000 mg | ORAL_TABLET | Freq: Every evening | ORAL | Status: DC

## 2016-01-28 MED ORDER — AZELASTINE-FLUTICASONE 137-50 MCG/ACT NA SUSP: NASAL | Status: DC

## 2016-01-28 NOTE — Progress Notes (Signed)
Pre visit review using our clinic review tool, if applicable. No additional management support is needed unless otherwise documented below in the visit note. 

## 2016-01-28 NOTE — Telephone Encounter (Signed)
astelin 1 spray each nostril bid  flonase 2 sprays each nostril qd   That is dymista ---- 2 nose sprays in 1-- so we can separate out

## 2016-01-28 NOTE — Patient Instructions (Signed)

## 2016-01-28 NOTE — Progress Notes (Signed)
   Subjective:    Patient ID: Veronica CrockerMelissa Yates, female    DOB: Jan 17, 1971, 45 y.o.   MRN: [id number]  HPI  Patient here for c/o sore throat x 2 days ,  No fever.  + runny nose, no sinus congestion.    Past Medical History  Diagnosis Date  . Depression     Review of Systems  Constitutional: Positive for chills. Negative for fever.  HENT: Positive for congestion, postnasal drip, rhinorrhea and sore throat. Negative for sinus pressure.   Respiratory: Positive for cough, chest tightness, shortness of breath and wheezing.   Cardiovascular: Negative for chest pain, palpitations and leg swelling.  Allergic/Immunologic: Negative for environmental allergies.       Objective:    Physical Exam  Constitutional: She appears well-developed and well-nourished.  HENT:  Head: Normocephalic and atraumatic.  Right Ear: Hearing, tympanic membrane, external ear and ear canal normal.  Left Ear: Hearing, tympanic membrane, external ear and ear canal normal.  Nose: Rhinorrhea present. Right sinus exhibits no maxillary sinus tenderness and no frontal sinus tenderness. Left sinus exhibits no maxillary sinus tenderness and no frontal sinus tenderness.  Mouth/Throat: Mucous membranes are not pale, not dry and not cyanotic. Posterior oropharyngeal erythema present. No oropharyngeal exudate or posterior oropharyngeal edema.  Skin: She is not diaphoretic.  Nursing note and vitals reviewed.   BP 111/76 mmHg  Pulse 86  Temp(Src) 98.5 F (36.9 C) (Oral)  Wt 215 lb 12.8 oz (97.886 kg)  SpO2 98% Wt Readings from Last 3 Encounters:  01/28/16 215 lb 12.8 oz (97.886 kg)  12/16/15 215 lb 6.4 oz (97.705 kg)  10/11/15 218 lb 11.1 oz (99.2 kg)     Lab Results  Component Value Date   WBC 6.0 12/16/2015   HGB 11.8* 12/16/2015   HCT 34.6* 12/16/2015   PLT 296.0 12/16/2015   GLUCOSE 108* 03/30/2013   ALT 23 03/30/2013   AST 20 03/30/2013   NA 142 03/30/2013   K 3.7 03/30/2013   CL 99 03/30/2013   CREATININE 0.9 03/30/2013   BUN 16 03/30/2013   CO2 25 03/30/2013   TSH 2.45 12/16/2015    Dg Chest 2 View  03/30/2013  *RADIOLOGY REPORT* Clinical Data: Shortness of breath.  Chest tightness. CHEST - 2 VIEW Comparison:  None. Findings:  The heart size and mediastinal contours are within normal limits.  Both lungs are clear.  The visualized skeletal structures are unremarkable. IMPRESSION: No active cardiopulmonary disease. Original Report Authenticated By: Myles RosenthalJohn Stahl, M.D.   Strep neg     Assessment & Plan:   Problem List Items Addressed This Visit    None    Visit Diagnoses    Sore throat    -  Primary    Relevant Orders    POCT rapid strep A (Completed)    Culture, Group A Strep    Seasonal allergies        Relevant Medications    levocetirizine (XYZAL) 5 MG tablet    Azelastine-Fluticasone 137-50 MCG/ACT SUSP        Donato SchultzYvonne R Lowne Chase, DO

## 2016-01-30 LAB — CULTURE, GROUP A STREP: ORGANISM ID, BACTERIA: NORMAL

## 2016-01-31 ENCOUNTER — Encounter: Payer: Self-pay | Admitting: *Deleted

## 2016-02-11 ENCOUNTER — Other Ambulatory Visit: Payer: Self-pay | Admitting: Family Medicine

## 2016-02-11 ENCOUNTER — Encounter: Payer: Self-pay | Admitting: Family Medicine

## 2016-02-11 DIAGNOSIS — G4733 Obstructive sleep apnea (adult) (pediatric): Secondary | ICD-10-CM

## 2016-02-11 NOTE — Telephone Encounter (Signed)
Refilled patients rx request for #30 2 rf.

## 2016-02-12 ENCOUNTER — Other Ambulatory Visit: Payer: Self-pay

## 2016-02-12 ENCOUNTER — Encounter: Payer: Self-pay | Admitting: Family Medicine

## 2016-02-12 DIAGNOSIS — D649 Anemia, unspecified: Secondary | ICD-10-CM

## 2016-02-12 DIAGNOSIS — R5382 Chronic fatigue, unspecified: Secondary | ICD-10-CM

## 2016-02-12 NOTE — Telephone Encounter (Signed)
Just take 1 a day She may also take vita D 3  2000 u daily Recheck labs in 1 month-----ccbcd, ibc, ferritin, vita d

## 2016-02-12 NOTE — Telephone Encounter (Signed)
Called and left a message for call back  

## 2016-02-12 NOTE — Telephone Encounter (Signed)
Pt is returning nurse call.    CB: [phone]

## 2016-02-12 NOTE — Telephone Encounter (Signed)
Per attached picture, Iron supplement is 25 mg which is 139 % of DV per bottle. Pt wanting to know how many she should take daily. Please advise.

## 2016-02-12 NOTE — Telephone Encounter (Signed)
Caller name: Vaughan Basta with [facility] Direct Line: [phone] Fax: [phone]   Reason for call: Call from [facility] 5/24 12:58pm - she called pt to schedule consult re: OSA and pt told her she already had a sleep study. She asked that we fax her copy of results. I advised her per OV notes 12/16/15 that pt had "home sleep study kit". Please f/u with Vaughan Basta.

## 2016-02-12 NOTE — Telephone Encounter (Signed)
Pt notified and made aware of provider's recommendations listed below.  She stated understanding and agrees with plan.  Lab appt scheduled as recommended.

## 2016-02-23 ENCOUNTER — Other Ambulatory Visit: Payer: Self-pay | Admitting: Family Medicine

## 2016-02-25 ENCOUNTER — Other Ambulatory Visit: Payer: Self-pay | Admitting: Family Medicine

## 2016-02-25 DIAGNOSIS — M5441 Lumbago with sciatica, right side: Secondary | ICD-10-CM

## 2016-02-25 MED ORDER — HYDROCODONE-ACETAMINOPHEN 5-325 MG PO TABS: 1.0000 | ORAL_TABLET | Freq: Four times a day (QID) | ORAL | Status: DC | PRN

## 2016-02-25 NOTE — Telephone Encounter (Signed)
Last seen 01/28/16 and filled 01/27/16 #45 UDS 05/08/15 low risk but neg for Adderall     Please advise    KP

## 2016-02-26 NOTE — Telephone Encounter (Signed)
Called West Pughonna Harris, with  [facility].  Lupita LeashDonna says that results were faxed over to our office, but says she will refax report for your review with the patient.  Per Lupita Leashonna, pt needs a CPAP.  Awaiting fax.  Called patient to give her an update.  Left a message for call back.

## 2016-02-27 ENCOUNTER — Ambulatory Visit (INDEPENDENT_AMBULATORY_CARE_PROVIDER_SITE_OTHER): Payer: Managed Care, Other (non HMO) | Admitting: Neurology

## 2016-02-27 ENCOUNTER — Encounter: Payer: Self-pay | Admitting: Neurology

## 2016-02-27 VITALS — BP 118/86 | HR 80 | Resp 20 | Ht 62.0 in | Wt 208.0 lb

## 2016-02-27 DIAGNOSIS — G4730 Sleep apnea, unspecified: Secondary | ICD-10-CM

## 2016-02-27 DIAGNOSIS — R0683 Snoring: Secondary | ICD-10-CM | POA: Diagnosis not present

## 2016-02-27 DIAGNOSIS — R5383 Other fatigue: Secondary | ICD-10-CM

## 2016-02-27 DIAGNOSIS — G4700 Insomnia, unspecified: Secondary | ICD-10-CM | POA: Diagnosis not present

## 2016-02-27 NOTE — Progress Notes (Signed)
S[facility]   Provider:  Melvyn Novas, M D  Referring Provider: Zola Button, Grayling Congress, * Primary Care Physician:  Donato Schultz, DO  Chief Complaint  Patient presents with  . New Patient (Initial Visit)    had sleep study, has apnea    HPI:  Veronica Yates is a 45 y.o. female , seen here as a referral from Dr. Loreen Freud, DO ,  Veronica Yates was concerned about not getting enough sleep to feel refreshed or restored and having a decreased level of energy and daytime when she last presented to Dr. Laury Axon, She has also mentioned insomnia, daytime sleepiness, nocturnal snoring and restless legs she often complains of headaches aching muscles, feeling hot, and bruising easily. She has suffered from depression and anxiety, she is a nonsmoker and nondrinker and has reduced her caffeine intake to one cup a day. Dr. Ernst Spell note included vital signs and weight measurements of the last 3 visits. Last visit on 01/28/2016. The patient was treated for a sore throat. Her white blood cell count and basic metabolic profile were normal. A chest x-ray was also normal limits. Dr. Laury Axon ordered a home sleep test for the patient which was performed on 02/04/2016. Total recording time was 355 minutes, the RDI was 37.5 a total of 5 apneas and 212 hypopneas were recorded. This would place the patient in the severe sleep apnea category. There was significant oxygen desaturation noted and the report also mentioned loud snoring.  Chief complaint according to patient : fatigue   Sleep habits are as follows: The patient works regular daytime hours and has no history of shift work. 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 She usually has a bedtime of 9 PM, but she may stay up until 11 PM she likes to watch TV or read on her computer before going to bed as well as in bed. Once she is asleep she does not stay asleep. She does not  experience nocturnal sleep without  at least 3 interruptions, And it is not the urge to urinate that wakes her up. She wakes up and cannot identify a reason why. Her husband has noted her to snore loudly, fall asleep while watching TV, and she seems to be restless in her sleep, moving a lot. She is not sure she even gets 5 hours of nocturnal sleep in total. The patient has usually to rise at 5 .45 AM and gets her children ready to school. She has 72-year-old twins, and 2 older children that are now out of the house. She usually will have a cup of coffee in the morning but does not drink caffeine aged beverages later in the day neither soda or iced tea. She will nap on a week and day but cannot find the time to do so during her work day.  Sleep medical history and family sleep history: The patient did not sleep walk in childhood and did not have night terrors or enuresis. Her mother has sleep apnea.  Social history: as above .   Review of Systems: Out of a complete 14 system review, the patient complains of only the following symptoms, and all other reviewed systems are negative.  How likely are you to doze in the following situations: 0 = not likely, 1 = slight chance, 2 = moderate chance, 3 = high chance  Sitting and Reading? 1 Watching Television? 3 Sitting inactive in a public place (theater or meeting)?2 As a  passenger in a car for an hour without a break?3  Lying down in the afternoon when circumstances permit?3 Sitting and talking to someone?0 Sitting quietly after lunch without alcohol?3 In a car, while stopped for a few minutes in traffic?0  Total = 15     Epworth score 15 , Fatigue severity score 49 ,   depression score PHQ 9 - on antidepressants. Had postpartum depression times 3.   The patient reports drowsiness while driving only if she drives long distance for longer than an hour uninterrupted. She easily will fall asleep if she is not mentally stimulated or physically active.   Social History    Social History  . Marital Status: Married    Spouse Name: N/A  . Number of Children: N/A  . Years of Education: N/A   Occupational History  . [employer]    Social History Main Topics  . Smoking status: Never Smoker   . Smokeless tobacco: Not on file  . Alcohol Use: No  . Drug Use: No  . Sexual Activity: Not on file   Other Topics Concern  . Not on file   Social History Narrative    Family History  Problem Relation Age of Onset  . Depression    . Bipolar disorder Sister   . Diabetes Mother   . Diabetes Father     Past Medical History  Diagnosis Date  . Depression     Past Surgical History  Procedure Laterality Date  . Dnc    . Tubal reversal    . Tonsillectomy    . Cesarean section    . Laparascopy      Current Outpatient Prescriptions  Medication Sig Dispense Refill  . Azelastine-Fluticasone 137-50 MCG/ACT SUSP 1 spray each nostril bid 1 Bottle 5  . buPROPion (WELLBUTRIN XL) 150 MG 24 hr tablet TAKE 2 TABLETS BY MOUTH EVERY MORNING 180 tablet 1  . clindamycin (CLEOCIN T) 1 % lotion APPLY TOPICALLY 2 TIMES DAILY. 60 mL 5  . clonazePAM (KLONOPIN) 1 MG tablet TAKE 1 TABLET BY MOUTH TWICE A DAY IF NEEDED 60 tablet 1  . eszopiclone (LUNESTA) 2 MG TABS tablet Take 1 tablet (2 mg total) by mouth at bedtime as needed for sleep. Take immediately before bedtime 30 tablet 0  . HYDROcodone-acetaminophen (NORCO/VICODIN) 5-325 MG tablet Take 1 tablet by mouth every 6 (six) hours as needed for moderate pain. 45 tablet 0  . meloxicam (MOBIC) 15 MG tablet TAKE 1 TABLET BY MOUTH EVERY DAY 30 tablet 2  . methocarbamol (ROBAXIN) 500 MG tablet 1 -2 po tid prn 45 tablet 1  . sertraline (ZOLOFT) 100 MG tablet TAKE 2 TABLETS BY MOUTH EVERY DAY 180 tablet 2  . amphetamine-dextroamphetamine (ADDERALL XR) 30 MG 24 hr capsule 2 po qd (Patient not taking: Reported on 02/27/2016) 60 capsule 0  . amphetamine-dextroamphetamine (ADDERALL XR) 30 MG 24 hr capsule 2 po qam (Patient  not taking: Reported on 02/27/2016) 60 capsule 0  . amphetamine-dextroamphetamine (ADDERALL XR) 30 MG 24 hr capsule 2 po qd (Patient not taking: Reported on 02/27/2016) 60 capsule 0   No current facility-administered medications for this visit.    Allergies as of 02/27/2016 - Review Complete 02/27/2016  Allergen Reaction Noted  . Penicillins  08/05/2010    Vitals: BP 118/86 mmHg  Pulse 80  Resp 20  Ht 5\' 2"  (1.575 m)  Wt 208 lb (94.348 kg)  BMI 38.03 kg/m2 Last Weight:  Wt Readings from Last 1 Encounters:  02/27/16 208 lb (94.348 kg)   VWU:JWJX mass index is 38.03 kg/(m^2).     Last Height:   Ht Readings from Last 1 Encounters:  02/27/16  (1.575 m)    Physical exam:  General: The patient is awake, alert and appears not in acute distress. The patient is well groomed. Head: Normocephalic, atraumatic. Neck is supple. Mallampati 1- the patient is status post tonsillectomy and has a very small uvula,  neck circumference:15.5 . Nasal airflow patent , TMJ is not evident . Retrognathia is not seen.  Cardiovascular:  Regular rate and rhythm , without  murmurs or carotid bruit, and without distended neck veins. Respiratory: Lungs are clear to auscultation. Skin:  Without evidence of edema, or rash Trunk: BMI is elevated . The patient's posture is erect.  Neurologic exam : The patient is awake and alert, oriented to place and time.   Memory subjective described as intact.  Attention span & concentration ability appears normal.  Speech is fluent,  without dysarthria, dysphonia or aphasia.  Mood and affect are appropriate.  Cranial nerves: Pupils are equal and briskly reactive to light. Extraocular movements  in vertical and horizontal planes intact and without nystagmus. Visual fields by finger perimetry are intact. Hearing to finger rub intact.   Facial sensation intact to fine touch.  Facial motor strength is symmetric and tongue and uvula move midline. Shoulder shrug was  symmetrical.   Motor exam:  Normal  tone, muscle bulk and symmetric strength  were found in all extremities.  Sensory:  Fine touch, pinprick and vibration in the upper extremities was normal.  Coordination: Rapid alternating movements in the fingers/hands was normal. Finger-to-nose maneuver  normal without evidence of ataxia, dysmetria or tremor.  Gait and station: Patient walks without assistive device and is able unassisted to climb up to the exam table. Strength within normal limits.  Stance is stable and normal.  Deep tendon reflexes: in the  upper and lower extremities are symmetric and intact.   The patient was advised of the nature of the diagnosed sleep disorder , the treatment options and risks for general a health and wellness arising from not treating the condition.  I spent more than 35 minutes of face to face time with the patient. Greater than 50% of time was spent in counseling and coordination of care. We have discussed the diagnosis and differential and I answered the patient's questions.    Assessment:  After physical and neurologic examination, review of laboratory studies,  Personal review of imaging studies, reports of other /same  Imaging studies ,  Results of polysomnography/ neurophysiology testing and pre-existing records as far as provided in visit., my assessment is   1)  Veronica Yates has several risk factors for obstructive sleep apnea including age, BMI and neck circumference but she has a very wide open upper airway. She is also not yet menopausal. Her loud snoring surprising giving that she had a probably accidental UPPP. Based on this result the patient should have an attended sleep study as patient after a UPPP and not considered candidates for auto titration. She will occasionally wake up with bad strong headaches and we should expand hours sleep study to include capnography.   Based on the home sleep test results I will order the study is a split-night  polysomnography as I anticipate a CPAP titration to follow.  The high degree of depression that she had suffered in the past seems not to be present at this time and she  feels that her depression is clinically well controlled. Her hypersomnia and fatigue can well be related to the low amount of sleep that she gets and the very fragmented sleep pattern that constitutes insomnia.   Plan:  Treatment plan and additional workup :  SPLITwith capnography.  Rv after sleep study.     Porfirio Mylar Joanmarie Tsang MD  02/27/2016   CC: Donato Schultz, Do [address] [place], [place] [address]

## 2016-03-05 ENCOUNTER — Encounter: Payer: Self-pay | Admitting: Family Medicine

## 2016-03-05 NOTE — Telephone Encounter (Signed)
Which RX?  Depends which one she needs.   [facility] can not be called in

## 2016-03-09 ENCOUNTER — Encounter: Payer: Self-pay | Admitting: Family Medicine

## 2016-03-13 ENCOUNTER — Other Ambulatory Visit: Payer: Self-pay | Admitting: Family Medicine

## 2016-03-16 ENCOUNTER — Other Ambulatory Visit: Payer: Managed Care, Other (non HMO)

## 2016-03-16 ENCOUNTER — Encounter: Payer: Self-pay | Admitting: Family Medicine

## 2016-03-16 NOTE — Telephone Encounter (Signed)
Last seen 01/28/16 and filled 01/14/16 #60 w/ 1 Rf  Please advise    KP

## 2016-03-24 ENCOUNTER — Other Ambulatory Visit: Payer: Self-pay | Admitting: Family Medicine

## 2016-03-24 DIAGNOSIS — M5441 Lumbago with sciatica, right side: Secondary | ICD-10-CM

## 2016-03-25 ENCOUNTER — Encounter: Payer: Self-pay | Admitting: Family Medicine

## 2016-03-25 ENCOUNTER — Other Ambulatory Visit: Payer: Self-pay | Admitting: Family Medicine

## 2016-03-25 DIAGNOSIS — M5441 Lumbago with sciatica, right side: Secondary | ICD-10-CM

## 2016-03-25 NOTE — Telephone Encounter (Signed)
This is a duplicate request. [facility]    KP

## 2016-03-25 NOTE — Telephone Encounter (Signed)
Last seen 01/28/16 and filled 02/25/16 #45 UDS 05/08/15 low risk   Please advise    KP

## 2016-03-26 MED ORDER — HYDROCODONE-ACETAMINOPHEN 5-325 MG PO TABS: 1.0000 | ORAL_TABLET | Freq: Four times a day (QID) | ORAL | Status: DC | PRN

## 2016-03-26 NOTE — Addendum Note (Signed)
Addended by: Sandford Craze'SULLIVAN, Anissia on: 03/26/2016 12:38 PM   Modules accepted: Orders

## 2016-03-26 NOTE — Telephone Encounter (Signed)
Rx placed at front desk for pick up at Pt's convenience.  

## 2016-03-26 NOTE — Telephone Encounter (Signed)
See rx. 

## 2016-03-27 ENCOUNTER — Encounter: Payer: Self-pay | Admitting: Neurology

## 2016-03-27 ENCOUNTER — Encounter: Payer: Self-pay | Admitting: Family Medicine

## 2016-04-04 ENCOUNTER — Other Ambulatory Visit: Payer: Self-pay | Admitting: Family Medicine

## 2016-04-06 NOTE — Telephone Encounter (Signed)
[facility] @ [phone]) asking the pt to RTC regarding refill request.//AB/CMA

## 2016-04-08 NOTE — Telephone Encounter (Signed)
[facility] @ [phone]) asking the pt to RTC regarding medication refills for the Wellbutrin XL and Clonazepam.  Needing to know if the pt has any refills at all left.//AB/CMA

## 2016-04-09 ENCOUNTER — Encounter: Payer: Self-pay | Admitting: Family Medicine

## 2016-04-20 ENCOUNTER — Ambulatory Visit (INDEPENDENT_AMBULATORY_CARE_PROVIDER_SITE_OTHER): Payer: Managed Care, Other (non HMO) | Admitting: Family Medicine

## 2016-04-20 ENCOUNTER — Encounter: Payer: Self-pay | Admitting: Family Medicine

## 2016-04-20 VITALS — BP 118/81 | HR 61 | Temp 98.3°F | Resp 16 | Ht 62.0 in | Wt 217.4 lb

## 2016-04-20 DIAGNOSIS — Z0000 Encounter for general adult medical examination without abnormal findings: Secondary | ICD-10-CM | POA: Diagnosis not present

## 2016-04-20 DIAGNOSIS — F988 Other specified behavioral and emotional disorders with onset usually occurring in childhood and adolescence: Secondary | ICD-10-CM

## 2016-04-20 DIAGNOSIS — D509 Iron deficiency anemia, unspecified: Secondary | ICD-10-CM | POA: Diagnosis not present

## 2016-04-20 DIAGNOSIS — R1013 Epigastric pain: Secondary | ICD-10-CM

## 2016-04-20 DIAGNOSIS — F909 Attention-deficit hyperactivity disorder, unspecified type: Secondary | ICD-10-CM | POA: Diagnosis not present

## 2016-04-20 LAB — CBC WITH DIFFERENTIAL/PLATELET
BASOS PCT: 0.8 % (ref 0.0–3.0)
Basophils Absolute: 0.1 10*3/uL (ref 0.0–0.1)
EOS ABS: 0.1 10*3/uL (ref 0.0–0.7)
EOS PCT: 2.1 % (ref 0.0–5.0)
HEMATOCRIT: 38.2 % (ref 36.0–46.0)
HEMOGLOBIN: 12.8 g/dL (ref 12.0–15.0)
LYMPHS PCT: 37.7 % (ref 12.0–46.0)
Lymphs Abs: 2.6 10*3/uL (ref 0.7–4.0)
MCHC: 33.5 g/dL (ref 30.0–36.0)
MCV: 87.9 fl (ref 78.0–100.0)
MONOS PCT: 7.4 % (ref 3.0–12.0)
Monocytes Absolute: 0.5 10*3/uL (ref 0.1–1.0)
NEUTROS ABS: 3.6 10*3/uL (ref 1.4–7.7)
Neutrophils Relative %: 52 % (ref 43.0–77.0)
PLATELETS: 343 10*3/uL (ref 150.0–400.0)
RBC: 4.35 Mil/uL (ref 3.87–5.11)
RDW: 14.1 % (ref 11.5–15.5)
WBC: 6.9 10*3/uL (ref 4.0–10.5)

## 2016-04-20 LAB — IBC PANEL
Iron: 75 ug/dL (ref 42–145)
SATURATION RATIOS: 18.6 % — AB (ref 20.0–50.0)
TRANSFERRIN: 288 mg/dL (ref 212.0–360.0)

## 2016-04-20 LAB — COMPREHENSIVE METABOLIC PANEL
ALBUMIN: 4.4 g/dL (ref 3.5–5.2)
ALT: 23 U/L (ref 0–35)
AST: 19 U/L (ref 0–37)
Alkaline Phosphatase: 67 U/L (ref 39–117)
BILIRUBIN TOTAL: 0.4 mg/dL (ref 0.2–1.2)
BUN: 14 mg/dL (ref 6–23)
CALCIUM: 9.8 mg/dL (ref 8.4–10.5)
CHLORIDE: 101 meq/L (ref 96–112)
CO2: 28 mEq/L (ref 19–32)
CREATININE: 0.57 mg/dL (ref 0.40–1.20)
GFR: 120.91 mL/min (ref >60.00)
Glucose, Bld: 88 mg/dL (ref 70–99)
Potassium: 3.6 mEq/L (ref 3.5–5.1)
Sodium: 138 mEq/L (ref 135–145)
Total Protein: 7.4 g/dL (ref 6.0–8.3)

## 2016-04-20 LAB — LIPID PANEL
CHOLESTEROL: 255 mg/dL — AB (ref 0–200)
HDL: 62.6 mg/dL (ref >39.00)
LDL Cholesterol: 169 mg/dL — ABNORMAL HIGH (ref 0–99)
NonHDL: 192.77
TRIGLYCERIDES: 117 mg/dL (ref 0.0–149.0)
Total CHOL/HDL Ratio: 4
VLDL: 23.4 mg/dL (ref 0.0–40.0)

## 2016-04-20 LAB — FERRITIN: Ferritin: 15.1 ng/mL (ref 10.0–291.0)

## 2016-04-20 LAB — TSH: TSH: 2.16 u[IU]/mL (ref 0.35–4.50)

## 2016-04-20 MED ORDER — AMPHETAMINE-DEXTROAMPHET ER 30 MG PO CP24
ORAL_CAPSULE | ORAL | 0 refills | Status: DC
Start: 2016-04-20 — End: 2016-09-25

## 2016-04-20 MED ORDER — AMPHETAMINE-DEXTROAMPHET ER 30 MG PO CP24
ORAL_CAPSULE | ORAL | 0 refills | Status: DC
Start: 2016-04-20 — End: 2016-08-03

## 2016-04-20 MED ORDER — PANTOPRAZOLE SODIUM 40 MG PO TBEC: 40.0000 mg | DELAYED_RELEASE_TABLET | Freq: Every day | ORAL | 3 refills | Status: DC

## 2016-04-20 NOTE — Patient Instructions (Addendum)
Preventive Care for Adults, Female A healthy lifestyle and preventive care can promote health and wellness. Preventive health guidelines for women include the following key practices.  A routine yearly physical is a good way to check with your health care provider about your health and preventive screening. It is a chance to share any concerns and updates on your health and to receive a thorough exam.  Visit your dentist for a routine exam and preventive care every 6 months. Brush your teeth twice a day and floss once a day. Good oral hygiene prevents tooth decay and gum disease.  The frequency of eye exams is based on your age, health, family medical history, use of contact lenses, and other factors. Follow your health care provider's recommendations for frequency of eye exams.  Eat a healthy diet. Foods like vegetables, fruits, whole grains, low-fat dairy products, and lean protein foods contain the nutrients you need without too many calories. Decrease your intake of foods high in solid fats, added sugars, and salt. Eat the right amount of calories for you.Get information about a proper diet from your health care provider, if necessary.  Regular physical exercise is one of the most important things you can do for your health. Most adults should get at least 150 minutes of moderate-intensity exercise (any activity that increases your heart rate and causes you to sweat) each week. In addition, most adults need muscle-strengthening exercises on 2 or more days a week.  Maintain a healthy weight. The body mass index (BMI) is a screening tool to identify possible weight problems. It provides an estimate of body fat based on height and weight. Your health care provider can find your BMI and can help you achieve or maintain a healthy weight.For adults 20 years and older:  A BMI below 18.5 is considered underweight.  A BMI of 18.5 to 24.9 is normal.  A BMI of 25 to 29.9 is considered overweight.  A  BMI of 30 and above is considered obese.  Maintain normal blood lipids and cholesterol levels by exercising and minimizing your intake of saturated fat. Eat a balanced diet with plenty of fruit and vegetables. Blood tests for lipids and cholesterol should begin at age 45 and be repeated every 5 years. If your lipid or cholesterol levels are high, you are over 50, or you are at high risk for heart disease, you may need your cholesterol levels checked more frequently.Ongoing high lipid and cholesterol levels should be treated with medicines if diet and exercise are not working.  If you smoke, find out from your health care provider how to quit. If you do not use tobacco, do not start.  Lung cancer screening is recommended for adults aged 45-80 years who are at high risk for developing lung cancer because of a history of smoking. A yearly low-dose CT scan of the lungs is recommended for people who have at least a 30-pack-year history of smoking and are a current smoker or have quit within the past 15 years. A pack year of smoking is smoking an average of 1 pack of cigarettes a day for 1 year (for example: 1 pack a day for 30 years or 2 packs a day for 15 years). Yearly screening should continue until the smoker has stopped smoking for at least 15 years. Yearly screening should be stopped for people who develop a health problem that would prevent them from having lung cancer treatment.  If you are pregnant, do not drink alcohol. If you are  breastfeeding, be very cautious about drinking alcohol. If you are not pregnant and choose to drink alcohol, do not have more than 1 drink per day. One drink is considered to be 12 ounces (355 mL) of beer, 5 ounces (148 mL) of wine, or 1.5 ounces (44 mL) of liquor.  Avoid use of street drugs. Do not share needles with anyone. Ask for help if you need support or instructions about stopping the use of drugs.  High blood pressure causes heart disease and increases the risk  of stroke. Your blood pressure should be checked at least every 1 to 2 years. Ongoing high blood pressure should be treated with medicines if weight loss and exercise do not work.  If you are 55-79 years old, ask your health care provider if you should take aspirin to prevent strokes.  Diabetes screening is done by taking a blood sample to check your blood glucose level after you have not eaten for a certain period of time (fasting). If you are not overweight and you do not have risk factors for diabetes, you should be screened once every 3 years starting at age 45. If you are overweight or obese and you are 40-70 years of age, you should be screened for diabetes every year as part of your cardiovascular risk assessment.  Breast cancer screening is essential preventive care for women. You should practice "breast self-awareness." This means understanding the normal appearance and feel of your breasts and may include breast self-examination. Any changes detected, no matter how small, should be reported to a health care provider. Women in their 20s and 30s should have a clinical breast exam (CBE) by a health care provider as part of a regular health exam every 1 to 3 years. After age 40, women should have a CBE every year. Starting at age 40, women should consider having a mammogram (breast X-ray test) every year. Women who have a family history of breast cancer should talk to their health care provider about genetic screening. Women at a high risk of breast cancer should talk to their health care providers about having an MRI and a mammogram every year.  Breast cancer gene (BRCA)-related cancer risk assessment is recommended for women who have family members with BRCA-related cancers. BRCA-related cancers include breast, ovarian, tubal, and peritoneal cancers. Having family members with these cancers may be associated with an increased risk for harmful changes (mutations) in the breast cancer genes BRCA1 and  BRCA2. Results of the assessment will determine the need for genetic counseling and BRCA1 and BRCA2 testing.  Your health care provider may recommend that you be screened regularly for cancer of the pelvic organs (ovaries, uterus, and vagina). This screening involves a pelvic examination, including checking for microscopic changes to the surface of your cervix (Pap test). You may be encouraged to have this screening done every 3 years, beginning at age 21.  For women ages 30-65, health care providers may recommend pelvic exams and Pap testing every 3 years, or they may recommend the Pap and pelvic exam, combined with testing for human papilloma virus (HPV), every 5 years. Some types of HPV increase your risk of cervical cancer. Testing for HPV may also be done on women of any age with unclear Pap test results.  Other health care providers may not recommend any screening for nonpregnant women who are considered low risk for pelvic cancer and who do not have symptoms. Ask your health care provider if a screening pelvic exam is right for   you.  If you have had past treatment for cervical cancer or a condition that could lead to cancer, you need Pap tests and screening for cancer for at least 20 years after your treatment. If Pap tests have been discontinued, your risk factors (such as having a new sexual partner) need to be reassessed to determine if screening should resume. Some women have medical problems that increase the chance of getting cervical cancer. In these cases, your health care provider may recommend more frequent screening and Pap tests.  Colorectal cancer can be detected and often prevented. Most routine colorectal cancer screening begins at the age of 50 years and continues through age 75 years. However, your health care provider may recommend screening at an earlier age if you have risk factors for colon cancer. On a yearly basis, your health care provider may provide home test kits to check  for hidden blood in the stool. Use of a small camera at the end of a tube, to directly examine the colon (sigmoidoscopy or colonoscopy), can detect the earliest forms of colorectal cancer. Talk to your health care provider about this at age 50, when routine screening begins. Direct exam of the colon should be repeated every 5-10 years through age 75 years, unless early forms of precancerous polyps or small growths are found.  People who are at an increased risk for hepatitis B should be screened for this virus. You are considered at high risk for hepatitis B if:  You were born in a country where hepatitis B occurs often. Talk with your health care provider about which countries are considered high risk.  Your parents were born in a high-risk country and you have not received a shot to protect against hepatitis B (hepatitis B vaccine).  You have HIV or AIDS.  You use needles to inject street drugs.  You live with, or have sex with, someone who has hepatitis B.  You get hemodialysis treatment.  You take certain medicines for conditions like cancer, organ transplantation, and autoimmune conditions.  Hepatitis C blood testing is recommended for all people born from 1945 through 1965 and any individual with known risks for hepatitis C.  Practice safe sex. Use condoms and avoid high-risk sexual practices to reduce the spread of sexually transmitted infections (STIs). STIs include gonorrhea, chlamydia, syphilis, trichomonas, herpes, HPV, and human immunodeficiency virus (HIV). Herpes, HIV, and HPV are viral illnesses that have no cure. They can result in disability, cancer, and death.  You should be screened for sexually transmitted illnesses (STIs) including gonorrhea and chlamydia if:  You are sexually active and are younger than 24 years.  You are older than 24 years and your health care provider tells you that you are at risk for this type of infection.  Your sexual activity has changed  since you were last screened and you are at an increased risk for chlamydia or gonorrhea. Ask your health care provider if you are at risk.  If you are at risk of being infected with HIV, it is recommended that you take a prescription medicine daily to prevent HIV infection. This is called preexposure prophylaxis (PrEP). You are considered at risk if:  You are sexually active and do not regularly use condoms or know the HIV status of your partner(s).  You take drugs by injection.  You are sexually active with a partner who has HIV.  Talk with your health care provider about whether you are at high risk of being infected with HIV. If   you choose to begin PrEP, you should first be tested for HIV. You should then be tested every 3 months for as long as you are taking PrEP.  Osteoporosis is a disease in which the bones lose minerals and strength with aging. This can result in serious bone fractures or breaks. The risk of osteoporosis can be identified using a bone density scan. Women ages 67 years and over and women at risk for fractures or osteoporosis should discuss screening with their health care providers. Ask your health care provider whether you should take a calcium supplement or vitamin D to reduce the rate of osteoporosis.  Menopause can be associated with physical symptoms and risks. Hormone replacement therapy is available to decrease symptoms and risks. You should talk to your health care provider about whether hormone replacement therapy is right for you.  Use sunscreen. Apply sunscreen liberally and repeatedly throughout the day. You should seek shade when your shadow is shorter than you. Protect yourself by wearing long sleeves, pants, a wide-brimmed hat, and sunglasses year round, whenever you are outdoors.  Once a month, do a whole body skin exam, using a mirror to look at the skin on your back. Tell your health care provider of new moles, moles that have irregular borders, moles that  are larger than a pencil eraser, or moles that have changed in shape or color.  Stay current with required vaccines (immunizations).  Influenza vaccine. All adults should be immunized every year.  Tetanus, diphtheria, and acellular pertussis (Td, Tdap) vaccine. Pregnant women should receive 1 dose of Tdap vaccine during each pregnancy. The dose should be obtained regardless of the length of time since the last dose. Immunization is preferred during the 27th-36th week of gestation. An adult who has not previously received Tdap or who does not know her vaccine status should receive 1 dose of Tdap. This initial dose should be followed by tetanus and diphtheria toxoids (Td) booster doses every 10 years. Adults with an unknown or incomplete history of completing a 3-dose immunization series with Td-containing vaccines should begin or complete a primary immunization series including a Tdap dose. Adults should receive a Td booster every 10 years.  Varicella vaccine. An adult without evidence of immunity to varicella should receive 2 doses or a second dose if she has previously received 1 dose. Pregnant females who do not have evidence of immunity should receive the first dose after pregnancy. This first dose should be obtained before leaving the health care facility. The second dose should be obtained 4-8 weeks after the first dose.  Human papillomavirus (HPV) vaccine. Females aged 13-26 years who have not received the vaccine previously should obtain the 3-dose series. The vaccine is not recommended for use in pregnant females. However, pregnancy testing is not needed before receiving a dose. If a female is found to be pregnant after receiving a dose, no treatment is needed. In that case, the remaining doses should be delayed until after the pregnancy. Immunization is recommended for any person with an immunocompromised condition through the age of 61 years if she did not get any or all doses earlier. During the  3-dose series, the second dose should be obtained 4-8 weeks after the first dose. The third dose should be obtained 24 weeks after the first dose and 16 weeks after the second dose.  Zoster vaccine. One dose is recommended for adults aged 30 years or older unless certain conditions are present.  Measles, mumps, and rubella (MMR) vaccine. Adults born  before 1957 generally are considered immune to measles and mumps. Adults born in 1957 or later should have 1 or more doses of MMR vaccine unless there is a contraindication to the vaccine or there is laboratory evidence of immunity to each of the three diseases. A routine second dose of MMR vaccine should be obtained at least 28 days after the first dose for students attending postsecondary schools, health care workers, or international travelers. People who received inactivated measles vaccine or an unknown type of measles vaccine during 1963-1967 should receive 2 doses of MMR vaccine. People who received inactivated mumps vaccine or an unknown type of mumps vaccine before 1979 and are at high risk for mumps infection should consider immunization with 2 doses of MMR vaccine. For females of childbearing age, rubella immunity should be determined. If there is no evidence of immunity, females who are not pregnant should be vaccinated. If there is no evidence of immunity, females who are pregnant should delay immunization until after pregnancy. Unvaccinated health care workers born before 1957 who lack laboratory evidence of measles, mumps, or rubella immunity or laboratory confirmation of disease should consider measles and mumps immunization with 2 doses of MMR vaccine or rubella immunization with 1 dose of MMR vaccine.  Pneumococcal 13-valent conjugate (PCV13) vaccine. When indicated, a person who is uncertain of his immunization history and has no record of immunization should receive the PCV13 vaccine. All adults 65 years of age and older should receive this  vaccine. An adult aged 19 years or older who has certain medical conditions and has not been previously immunized should receive 1 dose of PCV13 vaccine. This PCV13 should be followed with a dose of pneumococcal polysaccharide (PPSV23) vaccine. Adults who are at high risk for pneumococcal disease should obtain the PPSV23 vaccine at least 8 weeks after the dose of PCV13 vaccine. Adults older than 47 years of age who have normal immune system function should obtain the PPSV23 vaccine dose at least 1 year after the dose of PCV13 vaccine.  Pneumococcal polysaccharide (PPSV23) vaccine. When PCV13 is also indicated, PCV13 should be obtained first. All adults aged 65 years and older should be immunized. An adult younger than age 65 years who has certain medical conditions should be immunized. Any person who resides in a nursing home or long-term care facility should be immunized. An adult smoker should be immunized. People with an immunocompromised condition and certain other conditions should receive both PCV13 and PPSV23 vaccines. People with human immunodeficiency virus (HIV) infection should be immunized as soon as possible after diagnosis. Immunization during chemotherapy or radiation therapy should be avoided. Routine use of PPSV23 vaccine is not recommended for American Indians, [place] Natives, or people younger than 65 years unless there are medical conditions that require PPSV23 vaccine. When indicated, people who have unknown immunization and have no record of immunization should receive PPSV23 vaccine. One-time revaccination 5 years after the first dose of PPSV23 is recommended for people aged 19-64 years who have chronic kidney failure, nephrotic syndrome, asplenia, or immunocompromised conditions. People who received 1-2 doses of PPSV23 before age 65 years should receive another dose of PPSV23 vaccine at age 65 years or later if at least 5 years have passed since the previous dose. Doses of PPSV23 are not  needed for people immunized with PPSV23 at or after age 65 years.  Meningococcal vaccine. Adults with asplenia or persistent complement component deficiencies should receive 2 doses of quadrivalent meningococcal conjugate (MenACWY-D) vaccine. The doses should be obtained   at least 2 months apart. Microbiologists working with certain meningococcal bacteria, [place] recruits, people at risk during an outbreak, and people who travel to or live in countries with a high rate of meningitis should be immunized. A first-year college student up through age 34 years who is living in a residence hall should receive a dose if she did not receive a dose on or after her 16th birthday. Adults who have certain high-risk conditions should receive one or more doses of vaccine.  Hepatitis A vaccine. Adults who wish to be protected from this disease, have certain high-risk conditions, work with hepatitis A-infected animals, work in hepatitis A research labs, or travel to or work in countries with a high rate of hepatitis A should be immunized. Adults who were previously unvaccinated and who anticipate close contact with an international adoptee during the first 60 days after arrival in the [place] from a country with a high rate of hepatitis A should be immunized.  Hepatitis B vaccine. Adults who wish to be protected from this disease, have certain high-risk conditions, may be exposed to blood or other infectious body fluids, are household contacts or sex partners of hepatitis B positive people, are clients or workers in certain care facilities, or travel to or work in countries with a high rate of hepatitis B should be immunized.  Haemophilus influenzae type b (Hib) vaccine. A previously unvaccinated person with asplenia or sickle cell disease or having a scheduled splenectomy should receive 1 dose of Hib vaccine. Regardless of previous immunization, a recipient of a hematopoietic stem cell transplant should receive a  3-dose series 6-12 months after her successful transplant. Hib vaccine is not recommended for adults with HIV infection. Preventive Services / Frequency Ages 35 to 4 years  Blood pressure check.** / Every 3-5 years.  Lipid and cholesterol check.** / Every 5 years beginning at age 60.  Clinical breast exam.** / Every 3 years for women in their 71s and 10s.  BRCA-related cancer risk assessment.** / For women who have family members with a BRCA-related cancer (breast, ovarian, tubal, or peritoneal cancers).  Pap test.** / Every 2 years from ages 76 through 26. Every 3 years starting at age 61 through age 76 or 93 with a history of 3 consecutive normal Pap tests.  HPV screening.** / Every 3 years from ages 37 through ages 60 to 51 with a history of 3 consecutive normal Pap tests.  Hepatitis C blood test.** / For any individual with known risks for hepatitis C.  Skin self-exam. / Monthly.  Influenza vaccine. / Every year.  Tetanus, diphtheria, and acellular pertussis (Tdap, Td) vaccine.** / Consult your health care provider. Pregnant women should receive 1 dose of Tdap vaccine during each pregnancy. 1 dose of Td every 10 years.  Varicella vaccine.** / Consult your health care provider. Pregnant females who do not have evidence of immunity should receive the first dose after pregnancy.  HPV vaccine. / 3 doses over 6 months, if 93 and younger. The vaccine is not recommended for use in pregnant females. However, pregnancy testing is not needed before receiving a dose.  Measles, mumps, rubella (MMR) vaccine.** / You need at least 1 dose of MMR if you were born in 1957 or later. You may also need a 2nd dose. For females of childbearing age, rubella immunity should be determined. If there is no evidence of immunity, females who are not pregnant should be vaccinated. If there is no evidence of immunity, females who are  pregnant should delay immunization until after pregnancy.  Pneumococcal  13-valent conjugate (PCV13) vaccine.** / Consult your health care provider.  Pneumococcal polysaccharide (PPSV23) vaccine.** / 1 to 2 doses if you smoke cigarettes or if you have certain conditions.  Meningococcal vaccine.** / 1 dose if you are age 68 to 8 years and a Market researcher living in a residence hall, or have one of several medical conditions, you need to get vaccinated against meningococcal disease. You may also need additional booster doses.  Hepatitis A vaccine.** / Consult your health care provider.  Hepatitis B vaccine.** / Consult your health care provider.  Haemophilus influenzae type b (Hib) vaccine.** / Consult your health care provider. Ages 7 to 53 years  Blood pressure check.** / Every year.  Lipid and cholesterol check.** / Every 5 years beginning at age 25 years.  Lung cancer screening. / Every year if you are aged 11-80 years and have a 30-pack-year history of smoking and currently smoke or have quit within the past 15 years. Yearly screening is stopped once you have quit smoking for at least 15 years or develop a health problem that would prevent you from having lung cancer treatment.  Clinical breast exam.** / Every year after age 48 years.  BRCA-related cancer risk assessment.** / For women who have family members with a BRCA-related cancer (breast, ovarian, tubal, or peritoneal cancers).  Mammogram.** / Every year beginning at age 41 years and continuing for as long as you are in good health. Consult with your health care provider.  Pap test.** / Every 3 years starting at age 65 years through age 37 or 70 years with a history of 3 consecutive normal Pap tests.  HPV screening.** / Every 3 years from ages 72 years through ages 60 to 40 years with a history of 3 consecutive normal Pap tests.  Fecal occult blood test (FOBT) of stool. / Every year beginning at age 21 years and continuing until age 5 years. You may not need to do this test if you get  a colonoscopy every 10 years.  Flexible sigmoidoscopy or colonoscopy.** / Every 5 years for a flexible sigmoidoscopy or every 10 years for a colonoscopy beginning at age 35 years and continuing until age 48 years.  Hepatitis C blood test.** / For all people born from [birth year] through 1965 and any individual with known risks for hepatitis C.  Skin self-exam. / Monthly.  Influenza vaccine. / Every year.  Tetanus, diphtheria, and acellular pertussis (Tdap/Td) vaccine.** / Consult your health care provider. Pregnant women should receive 1 dose of Tdap vaccine during each pregnancy. 1 dose of Td every 10 years.  Varicella vaccine.** / Consult your health care provider. Pregnant females who do not have evidence of immunity should receive the first dose after pregnancy.  Zoster vaccine.** / 1 dose for adults aged 30 years or older.  Measles, mumps, rubella (MMR) vaccine.** / You need at least 1 dose of MMR if you were born in 1957 or later. You may also need a second dose. For females of childbearing age, rubella immunity should be determined. If there is no evidence of immunity, females who are not pregnant should be vaccinated. If there is no evidence of immunity, females who are pregnant should delay immunization until after pregnancy.  Pneumococcal 13-valent conjugate (PCV13) vaccine.** / Consult your health care provider.  Pneumococcal polysaccharide (PPSV23) vaccine.** / 1 to 2 doses if you smoke cigarettes or if you have certain conditions.  Meningococcal vaccine.** /  Consult your health care provider.  Hepatitis A vaccine.** / Consult your health care provider.  Hepatitis B vaccine.** / Consult your health care provider.  Haemophilus influenzae type b (Hib) vaccine.** / Consult your health care provider. Ages 65 years and over  Blood pressure check.** / Every year.  Lipid and cholesterol check.** / Every 5 years beginning at age 20 years.  Lung cancer screening. / Every year if you  are aged 55-80 years and have a 30-pack-year history of smoking and currently smoke or have quit within the past 15 years. Yearly screening is stopped once you have quit smoking for at least 15 years or develop a health problem that would prevent you from having lung cancer treatment.  Clinical breast exam.** / Every year after age 40 years.  BRCA-related cancer risk assessment.** / For women who have family members with a BRCA-related cancer (breast, ovarian, tubal, or peritoneal cancers).  Mammogram.** / Every year beginning at age 40 years and continuing for as long as you are in good health. Consult with your health care provider.  Pap test.** / Every 3 years starting at age 30 years through age 65 or 70 years with 3 consecutive normal Pap tests. Testing can be stopped between 65 and 70 years with 3 consecutive normal Pap tests and no abnormal Pap or HPV tests in the past 10 years.  HPV screening.** / Every 3 years from ages 30 years through ages 65 or 70 years with a history of 3 consecutive normal Pap tests. Testing can be stopped between 65 and 70 years with 3 consecutive normal Pap tests and no abnormal Pap or HPV tests in the past 10 years.  Fecal occult blood test (FOBT) of stool. / Every year beginning at age 50 years and continuing until age 75 years. You may not need to do this test if you get a colonoscopy every 10 years.  Flexible sigmoidoscopy or colonoscopy.** / Every 5 years for a flexible sigmoidoscopy or every 10 years for a colonoscopy beginning at age 50 years and continuing until age 75 years.  Hepatitis C blood test.** / For all people born from 1945 through 1965 and any individual with known risks for hepatitis C.  Osteoporosis screening.** / A one-time screening for women ages 65 years and over and women at risk for fractures or osteoporosis.  Skin self-exam. / Monthly.  Influenza vaccine. / Every year.  Tetanus, diphtheria, and acellular pertussis (Tdap/Td)  vaccine.** / 1 dose of Td every 10 years.  Varicella vaccine.** / Consult your health care provider.  Zoster vaccine.** / 1 dose for adults aged 60 years or older.  Pneumococcal 13-valent conjugate (PCV13) vaccine.** / Consult your health care provider.  Pneumococcal polysaccharide (PPSV23) vaccine.** / 1 dose for all adults aged 65 years and older.  Meningococcal vaccine.** / Consult your health care provider.  Hepatitis A vaccine.** / Consult your health care provider.  Hepatitis B vaccine.** / Consult your health care provider.  Haemophilus influenzae type b (Hib) vaccine.** / Consult your health care provider. ** Family history and personal history of risk and conditions may change your health care provider's recommendations.   This information is not intended to replace advice given to you by your health care provider. Make sure you discuss any questions you have with your health care provider.   Document Released: 11/03/2001 Document Revised: 09/28/2014 Document Reviewed: 02/02/2011 Elsevier Interactive Patient Education 2016 Elsevier Inc. Food Choices for Gastroesophageal Reflux Disease, Adult When you have gastroesophageal reflux   disease (GERD), the foods you eat and your eating habits are very important. Choosing the right foods can help ease the discomfort of GERD. WHAT GENERAL GUIDELINES DO I NEED TO FOLLOW?  Choose fruits, vegetables, whole grains, low-fat dairy products, and low-fat meat, fish, and poultry.  Limit fats such as oils, salad dressings, butter, nuts, and avocado.  Keep a food diary to identify foods that cause symptoms.  Avoid foods that cause reflux. These may be different for different people.  Eat frequent small meals instead of three large meals each day.  Eat your meals slowly, in a relaxed setting.  Limit fried foods.  Cook foods using methods other than frying.  Avoid drinking alcohol.  Avoid drinking large amounts of liquids with your  meals.  Avoid bending over or lying down until 2-3 hours after eating. WHAT FOODS ARE NOT RECOMMENDED? The following are some foods and drinks that may worsen your symptoms: Vegetables Tomatoes. Tomato juice. Tomato and spaghetti sauce. Chili peppers. Onion and garlic. Horseradish. Fruits Oranges, grapefruit, and lemon (fruit and juice). Meats High-fat meats, fish, and poultry. This includes hot dogs, ribs, ham, sausage, salami, and bacon. Dairy Whole milk and chocolate milk. Sour cream. Cream. Butter. Ice cream. Cream cheese.  Beverages Coffee and tea, with or without caffeine. Carbonated beverages or energy drinks. Condiments Hot sauce. Barbecue sauce.  Sweets/Desserts Chocolate and cocoa. Donuts. Peppermint and spearmint. Fats and Oils High-fat foods, including French fries and potato chips. Other Vinegar. Strong spices, such as black pepper, white pepper, red pepper, cayenne, curry powder, cloves, ginger, and chili powder. The items listed above may not be a complete list of foods and beverages to avoid. Contact your dietitian for more information.   This information is not intended to replace advice given to you by your health care provider. Make sure you discuss any questions you have with your health care provider.   Document Released: 09/07/2005 Document Revised: 09/28/2014 Document Reviewed: 07/12/2013 Elsevier Interactive Patient Education 2016 Elsevier Inc.  

## 2016-04-20 NOTE — Assessment & Plan Note (Signed)
gerd instructions avs

## 2016-04-20 NOTE — Progress Notes (Signed)
Subjective:     Veronica Yates is a 45 y.o. female and is here for a comprehensive physical exam. The patient reports no problems.---- pt seeing neuro for possible sleep apnea.  Ins co initially denied sleep study.   Neuro is appealing decision.   Pt is also c/o gerd-- she is taking tums with no relief.  Symptoms x 6 months  Social History   Social History  . Marital status: Married    Spouse name: N/A  . Number of children: N/A  . Years of education: N/A   Occupational History  . [employer]    Social History Main Topics  . Smoking status: Never Smoker  . Smokeless tobacco: Never Used  . Alcohol use No  . Drug use: No  . Sexual activity: Not on file   Other Topics Concern  . Not on file   Social History Narrative  . No narrative on file   Health Maintenance  Topic Date Due  . PAP SMEAR  10/10/2016 (Originally 06/13/1990)  . HIV Screening  04/20/2017 (Originally [birth year]/13/1984)  . INFLUENZA VACCINE  04/21/2016  . TETANUS/TDAP  06/29/2016    The following portions of the patient's history were reviewed and updated as appropriate: She  has a past medical history of Depression and GERD (gastroesophageal reflux disease). She  does not have any pertinent problems on file. She  has a past surgical history that includes dnc; tubal reversal; Tonsillectomy; Cesarean section; and laparascopy. Her family history includes Bipolar disorder in her sister; Diabetes in her father and mother. She  reports that she has never smoked. She has never used smokeless tobacco. She reports that she does not drink alcohol or use drugs. She has a current medication list which includes the following prescription(s): amphetamine-dextroamphetamine, amphetamine-dextroamphetamine, amphetamine-dextroamphetamine, bupropion, clindamycin, clonazepam, eszopiclone, hydrocodone-acetaminophen, sertraline, azelastine-fluticasone, meloxicam, methocarbamol, and pantoprazole. Current Outpatient Prescriptions on  File Prior to Visit  Medication Sig Dispense Refill  . buPROPion (WELLBUTRIN XL) 150 MG 24 hr tablet TAKE 2 TABLETS BY MOUTH EVERY MORNING 180 tablet 1  . clindamycin (CLEOCIN T) 1 % lotion APPLY TOPICALLY 2 TIMES DAILY. 60 mL 5  . clonazePAM (KLONOPIN) 1 MG tablet TAKE 1 TABLET BY MOUTH TWICE A DAY IF NEEDED 60 tablet 1  . eszopiclone (LUNESTA) 2 MG TABS tablet Take 1 tablet (2 mg total) by mouth at bedtime as needed for sleep. Take immediately before bedtime 30 tablet 0  . HYDROcodone-acetaminophen (NORCO/VICODIN) 5-325 MG tablet Take 1 tablet by mouth every 6 (six) hours as needed for moderate pain. 45 tablet 0  . sertraline (ZOLOFT) 100 MG tablet TAKE 2 TABLETS BY MOUTH EVERY DAY 180 tablet 2  . Azelastine-Fluticasone 137-50 MCG/ACT SUSP 1 spray each nostril bid (Patient not taking: Reported on 04/20/2016) 1 Bottle 5  . meloxicam (MOBIC) 15 MG tablet TAKE 1 TABLET BY MOUTH EVERY DAY (Patient not taking: Reported on 04/20/2016) 30 tablet 2  . methocarbamol (ROBAXIN) 500 MG tablet 1 -2 po tid prn (Patient not taking: Reported on 04/20/2016) 45 tablet 1   No current facility-administered medications on file prior to visit.    She is allergic to penicillins..  Review of Systems Review of Systems  Constitutional: Negative for activity change, appetite change and fatigue.  HENT: Negative for hearing loss, congestion, tinnitus and ear discharge.  dentist q46m Eyes: Negative for visual disturbance (see optho q1y -- vision corrected to 20/20 with glasses).  Respiratory: Negative for cough, chest tightness and shortness of breath.   Cardiovascular:  Negative for chest pain, palpitations and leg swelling.  Gastrointestinal: Negative for abdominal pain, diarrhea, constipation and abdominal distention. + dyspepsia  Genitourinary: Negative for urgency, frequency, decreased urine volume and difficulty urinating.  Musculoskeletal: Negative for back pain, arthralgias and gait problem.  Skin: Negative for  color change, pallor and rash.  Neurological: Negative for dizziness, light-headedness, numbness and headaches.  Hematological: Negative for adenopathy. Does not bruise/bleed easily.  Psychiatric/Behavioral: Negative for suicidal ideas, confusion, sleep disturbance, self-injury, dysphoric mood, decreased concentration and agitation.       Objective:    BP 118/81 (BP Location: Left Arm, Patient Position: Sitting, Cuff Size: Large)   Pulse 61   Temp 98.3 F (36.8 C)   Resp 16   Ht  (1.575 m)   Wt 217 lb 6.4 oz (98.6 kg)   SpO2 99%   BMI 39.76 kg/m  General appearance: alert, cooperative, appears stated age and no distress Head: Normocephalic, without obvious abnormality, atraumatic Eyes: conjunctivae/corneas clear. PERRL, EOM's intact. Fundi benign. Ears: normal TM's and external ear canals both ears Nose: Nares normal. Septum midline. Mucosa normal. No drainage or sinus tenderness. Throat: lips, mucosa, and tongue normal; teeth and gums normal Neck: no adenopathy, no carotid bruit, no JVD, supple, symmetrical, trachea midline and thyroid not enlarged, symmetric, no tenderness/mass/nodules Back: symmetric, no curvature. ROM normal. No CVA tenderness. Lungs: clear to auscultation bilaterally Breasts: gyn Heart: regular rate and rhythm, S1, S2 normal, no murmur, click, rub or gallop Abdomen: soft, non-tender; bowel sounds normal; no masses,  no organomegaly Pelvic: deferred--gyn Extremities: extremities normal, atraumatic, no cyanosis or edema Pulses: 2+ and symmetric Skin: Skin color, texture, turgor normal. No rashes or lesions Lymph nodes: Cervical, supraclavicular, and axillary nodes normal. Neurologic: Alert and oriented X 3, normal strength and tone. Normal symmetric reflexes. Normal coordination and gait    Assessment:    Healthy female exam.      Plan:    ghm utd Check labs See After Visit Summary for Counseling Recommendations    1. Preventative health  care See above - Lipid panel - CBC with Differential/Platelet - Comprehensive metabolic panel - POCT urinalysis dipstick - TSH  2. Anemia, iron deficiency  - CBC with Differential/Platelet - IBC panel - Ferritin  3. ADD (attention deficit disorder) Refill meds - amphetamine-dextroamphetamine (ADDERALL XR) 30 MG 24 hr capsule; 2 po qd  Dispense: 60 capsule; Refill: 0 - amphetamine-dextroamphetamine (ADDERALL XR) 30 MG 24 hr capsule; 2 po qam  Dispense: 60 capsule; Refill: 0 - amphetamine-dextroamphetamine (ADDERALL XR) 30 MG 24 hr capsule; 2 po qd  Dispense: 60 capsule; Refill: 0  4. Dyspepsia Start-- may need referral to gi - pantoprazole (PROTONIX) 40 MG tablet; Take 1 tablet (40 mg total) by mouth daily.  Dispense: 30 tablet; Refill: 3

## 2016-04-22 ENCOUNTER — Other Ambulatory Visit: Payer: Self-pay

## 2016-04-22 DIAGNOSIS — J302 Other seasonal allergic rhinitis: Secondary | ICD-10-CM

## 2016-04-22 MED ORDER — LEVOCETIRIZINE DIHYDROCHLORIDE 5 MG PO TABS: 5.0000 mg | ORAL_TABLET | Freq: Every evening | ORAL | 3 refills | Status: DC

## 2016-04-22 NOTE — Telephone Encounter (Signed)
Rx faxed.    KP 

## 2016-04-23 ENCOUNTER — Other Ambulatory Visit: Payer: Self-pay | Admitting: Family

## 2016-04-23 DIAGNOSIS — M5441 Lumbago with sciatica, right side: Secondary | ICD-10-CM

## 2016-04-23 MED ORDER — HYDROCODONE-ACETAMINOPHEN 5-325 MG PO TABS: 1.0000 | ORAL_TABLET | Freq: Four times a day (QID) | ORAL | 0 refills | Status: DC | PRN

## 2016-04-23 NOTE — Telephone Encounter (Signed)
Last seen 04/20/16 and filled 03/26/16 #45 UDS 01/28/16 low risk   Please advise     KP

## 2016-04-24 ENCOUNTER — Encounter: Payer: Self-pay | Admitting: Family Medicine

## 2016-04-24 ENCOUNTER — Encounter: Payer: Self-pay | Admitting: Neurology

## 2016-04-24 NOTE — Telephone Encounter (Signed)
Forwarded to [employer], coordinator in sleep lab. D

## 2016-05-08 ENCOUNTER — Encounter: Payer: Self-pay | Admitting: Family Medicine

## 2016-05-08 MED ORDER — BUPROPION HCL ER (XL) 150 MG PO TB24: 300.0000 mg | ORAL_TABLET | Freq: Every morning | ORAL | 1 refills | Status: DC

## 2016-05-16 ENCOUNTER — Other Ambulatory Visit: Payer: Self-pay | Admitting: Family Medicine

## 2016-05-26 ENCOUNTER — Other Ambulatory Visit: Payer: Self-pay | Admitting: Family Medicine

## 2016-05-26 ENCOUNTER — Encounter: Payer: Self-pay | Admitting: Family Medicine

## 2016-05-26 DIAGNOSIS — M5441 Lumbago with sciatica, right side: Secondary | ICD-10-CM

## 2016-05-26 MED ORDER — HYDROCODONE-ACETAMINOPHEN 5-325 MG PO TABS: 1.0000 | ORAL_TABLET | Freq: Four times a day (QID) | ORAL | 0 refills | Status: DC | PRN

## 2016-05-26 NOTE — Telephone Encounter (Signed)
Last seen 04/20/16 and filled 04/23/16 #45 UDS 01/28/16 Neg for Hydrocodone  Please advise     KP

## 2016-06-02 ENCOUNTER — Telehealth: Payer: Self-pay

## 2016-06-02 ENCOUNTER — Encounter: Payer: Self-pay | Admitting: Neurology

## 2016-06-02 DIAGNOSIS — R0683 Snoring: Secondary | ICD-10-CM

## 2016-06-02 DIAGNOSIS — G4730 Sleep apnea, unspecified: Principal | ICD-10-CM

## 2016-06-02 DIAGNOSIS — G4700 Insomnia, unspecified: Secondary | ICD-10-CM

## 2016-06-02 NOTE — Telephone Encounter (Signed)
Dawn, can you please call the pt and update her on the status of her sleep study?

## 2016-06-03 ENCOUNTER — Other Ambulatory Visit: Payer: Self-pay | Admitting: Family Medicine

## 2016-06-03 NOTE — Telephone Encounter (Signed)
Veronica Yates could not see the order for sleep study under procedures that Dr. Vickey Hugerohmeier originally ordered. I put in a duplicate order for the split night sleep study.

## 2016-06-04 NOTE — Addendum Note (Signed)
Addended by: Geronimo RunningINKINS, Johany Hansman A on: 06/04/2016 09:32 AM   Modules accepted: Orders

## 2016-06-04 NOTE — Telephone Encounter (Signed)
Last seen 04/20/16 Last filled #60-1 refill Please advise   PC

## 2016-06-15 ENCOUNTER — Telehealth: Payer: Self-pay

## 2016-06-15 NOTE — Telephone Encounter (Signed)
LM for patient that [facility]. We will call and schedule study when we get authorization.

## 2016-06-24 ENCOUNTER — Other Ambulatory Visit: Payer: Self-pay | Admitting: Family Medicine

## 2016-06-24 DIAGNOSIS — M5441 Lumbago with sciatica, right side: Secondary | ICD-10-CM

## 2016-06-24 MED ORDER — HYDROCODONE-ACETAMINOPHEN 5-325 MG PO TABS
1.0000 | ORAL_TABLET | Freq: Four times a day (QID) | ORAL | 0 refills | Status: DC | PRN
Start: 2016-06-24 — End: 2016-06-25

## 2016-06-24 NOTE — Telephone Encounter (Signed)
Last seen 04/20/16 and filled 05/26/16 #45 UDS 01/28/16 low risk   Please advise    KP

## 2016-06-25 ENCOUNTER — Encounter: Payer: Self-pay | Admitting: Family Medicine

## 2016-06-25 MED ORDER — HYDROCODONE-ACETAMINOPHEN 5-325 MG PO TABS: 1.0000 | ORAL_TABLET | Freq: Four times a day (QID) | ORAL | 0 refills | Status: DC | PRN

## 2016-06-25 NOTE — Addendum Note (Signed)
Addended by: Arnette NorrisPAYNE, Stamatia Masri P on: 06/25/2016 10:52 AM   Modules accepted: Orders

## 2016-07-22 ENCOUNTER — Other Ambulatory Visit: Payer: Self-pay | Admitting: Family Medicine

## 2016-07-22 ENCOUNTER — Encounter: Payer: Self-pay | Admitting: Family Medicine

## 2016-07-22 ENCOUNTER — Telehealth: Payer: Self-pay | Admitting: Neurology

## 2016-07-22 ENCOUNTER — Encounter: Payer: Self-pay | Admitting: Neurology

## 2016-07-22 DIAGNOSIS — G4733 Obstructive sleep apnea (adult) (pediatric): Secondary | ICD-10-CM

## 2016-07-22 DIAGNOSIS — M5441 Lumbago with sciatica, right side: Secondary | ICD-10-CM

## 2016-07-22 NOTE — Telephone Encounter (Signed)
Last hydrocodone Rx:  06/25/16, #45 UDS: 05/26/16, low risk  Pt requesting to pick up Rx on Friday, 07/24/16.  Please advise?

## 2016-07-22 NOTE — Telephone Encounter (Signed)
[employer] denied HST for the patient.  I marked it in the system at an earlier time but didn't get a message put in to the nurse.

## 2016-07-22 NOTE — Telephone Encounter (Signed)
This pt has been trying since June to get her sleep study scheduled. First, [employer] denied her in lab study. Now, her HST has been denied. Pt has followed up with us several times trying to figure out what is going on and asking for her sleep study to be scheduled. Is there anything we can do for her?

## 2016-07-23 MED ORDER — HYDROCODONE-ACETAMINOPHEN 5-325 MG PO TABS: 1.0000 | ORAL_TABLET | Freq: Four times a day (QID) | ORAL | 0 refills | Status: DC | PRN

## 2016-07-23 NOTE — Telephone Encounter (Signed)
Rx placed at front desk for pick up.  

## 2016-07-23 NOTE — Telephone Encounter (Signed)
Ok to give pain med x1 Needs ov

## 2016-07-23 NOTE — Telephone Encounter (Signed)
Rx placed at front desk for pick up. Pt informed via MyChart.  

## 2016-07-23 NOTE — Telephone Encounter (Signed)
Rx printed, awaiting DO signature.  

## 2016-07-23 NOTE — Telephone Encounter (Signed)
Please advise. TL/CMA 

## 2016-07-27 NOTE — Telephone Encounter (Signed)
Zella BallRobin advised me that she spoke to Dr. Vickey Hugerohmeier regarding this pt and an auto pap was recommended by Dr. Vickey Hugerohmeier since pt's insurance has denied an HST through our office and an in-lab sleep study.  Pt had an HST done 02/26/2016 that showed an RDI of 37.5/hr and the interpretation says "During the recording there was evidence of severe sleep apnea with significant oxygen desaturation.""  Dr. Vickey Hugerohmeier, will you place the auto pap order you would like for this pt?

## 2016-07-27 NOTE — Telephone Encounter (Signed)
I called pt to discuss. No answer, left a message asking her to call me back. 

## 2016-07-28 NOTE — Telephone Encounter (Signed)
I spoke to pt and advised her that Dr. Vickey Hugerohmeier wants to start pt on a cpap and has ordered an auto cpap. I explained that the order for this cpap will be sent to a DME and they will call her to get it set up and show her how to use it. Pt is agreeable to this. Will send to [facility]. A follow up appt was made for pt on 10/08/16 at 9:30am. Pt verbalized understanding.

## 2016-07-29 ENCOUNTER — Telehealth: Payer: Self-pay | Admitting: *Deleted

## 2016-07-29 NOTE — Telephone Encounter (Signed)
[facility] and [facility] @ 6:23pm @ [phone]) asking the pt to RTC regarding medications on the UDS results.    Informed the pt that I will be out of the office on Thursday,but she can call back on Thursday and ask to [facility].  Or she can call back on Friday.  Dr. Zola ButtonLowne Chase wanted to know how often the pt gets the Adderall and Norco.  Both prescriptions were negative on her UDS.//AB/CMA

## 2016-07-30 ENCOUNTER — Encounter: Payer: Self-pay | Admitting: Family Medicine

## 2016-07-31 ENCOUNTER — Encounter: Payer: Self-pay | Admitting: Family Medicine

## 2016-08-02 ENCOUNTER — Other Ambulatory Visit: Payer: Self-pay | Admitting: Family Medicine

## 2016-08-03 ENCOUNTER — Other Ambulatory Visit: Payer: Self-pay | Admitting: Family Medicine

## 2016-08-03 DIAGNOSIS — M5441 Lumbago with sciatica, right side: Secondary | ICD-10-CM

## 2016-08-03 DIAGNOSIS — F411 Generalized anxiety disorder: Secondary | ICD-10-CM

## 2016-08-03 DIAGNOSIS — F988 Other specified behavioral and emotional disorders with onset usually occurring in childhood and adolescence: Secondary | ICD-10-CM

## 2016-08-03 MED ORDER — HYDROCODONE-ACETAMINOPHEN 5-325 MG PO TABS: 1.0000 | ORAL_TABLET | Freq: Four times a day (QID) | ORAL | 0 refills | Status: DC | PRN

## 2016-08-03 MED ORDER — CLONAZEPAM 1 MG PO TABS: 1.0000 mg | ORAL_TABLET | Freq: Two times a day (BID) | ORAL | 1 refills | Status: DC | PRN

## 2016-08-03 MED ORDER — AMPHETAMINE-DEXTROAMPHET ER 30 MG PO CP24: ORAL_CAPSULE | ORAL | 0 refills | Status: DC

## 2016-08-03 NOTE — Telephone Encounter (Signed)
printed

## 2016-08-03 NOTE — Telephone Encounter (Signed)
Pt is requesting refill on Adderall and Klonopin.  Last OV: 04/20/2016 Last Fill on Adderall: 04/20/2016 #60 and 0RF For September and October 2017 Last Fill on Klonopin: 06/04/2016 #60 and 1RF UDS: 05/26/2016 No result in FYI section  Please advise.

## 2016-08-04 ENCOUNTER — Other Ambulatory Visit: Payer: Self-pay | Admitting: Family Medicine

## 2016-08-04 ENCOUNTER — Other Ambulatory Visit: Payer: Managed Care, Other (non HMO)

## 2016-08-04 ENCOUNTER — Telehealth: Payer: Self-pay

## 2016-08-04 DIAGNOSIS — F411 Generalized anxiety disorder: Secondary | ICD-10-CM

## 2016-08-04 NOTE — Telephone Encounter (Signed)
Patient called, informed her Rx is ready for pick up in front. UDS is needed.

## 2016-08-05 ENCOUNTER — Other Ambulatory Visit: Payer: Self-pay | Admitting: Family Medicine

## 2016-08-05 DIAGNOSIS — F411 Generalized anxiety disorder: Secondary | ICD-10-CM

## 2016-08-05 NOTE — Telephone Encounter (Signed)
Last Clonazepam1mg  RF was 08/05/2016. Last OV: 04/20/2016 Next OV: 09/18/2016 UDS: Low risk; Sample given on 05/26/2016  Rx printed and awaiting PCP signature.

## 2016-08-05 NOTE — Telephone Encounter (Signed)
Last Clonazapem 1mg  RF was on 08/05/2016; #60 0RF Last OV: 04/20/2016 Next OV: 09/08/2016  UDS (05/26/2016, sample given, low risk).  Rx printed and awaiting PCP signature.

## 2016-08-06 ENCOUNTER — Telehealth: Payer: Self-pay

## 2016-08-06 ENCOUNTER — Encounter: Payer: Self-pay | Admitting: Family Medicine

## 2016-08-06 NOTE — Telephone Encounter (Signed)
Rx printed and faxed. Klonopin. LB

## 2016-08-07 ENCOUNTER — Other Ambulatory Visit: Payer: Self-pay | Admitting: Family Medicine

## 2016-08-07 DIAGNOSIS — F411 Generalized anxiety disorder: Secondary | ICD-10-CM

## 2016-08-07 NOTE — Telephone Encounter (Signed)
Pt spoke with someone on (07/30/16) regarding her medications.//AB/CMA

## 2016-08-12 ENCOUNTER — Telehealth: Payer: Self-pay | Admitting: *Deleted

## 2016-08-12 DIAGNOSIS — R1013 Epigastric pain: Secondary | ICD-10-CM

## 2016-08-12 MED ORDER — PANTOPRAZOLE SODIUM 40 MG PO TBEC
40.0000 mg | DELAYED_RELEASE_TABLET | Freq: Every day | ORAL | 1 refills | Status: DC
Start: 2016-08-12 — End: 2016-10-18

## 2016-08-12 NOTE — Telephone Encounter (Signed)
Received [facility] from [facility] requesting 90 day supply of pantoprazole. Pt has f/u 09/18/16.  Refill sent.

## 2016-08-19 ENCOUNTER — Other Ambulatory Visit: Payer: Self-pay

## 2016-08-19 DIAGNOSIS — R1013 Epigastric pain: Secondary | ICD-10-CM

## 2016-08-19 NOTE — Telephone Encounter (Signed)
LVM for Pt advising them that the Pantoprazole SOD DR 40mg  tab (#90, 1RF) was [facility] [place] [place]. [place] [facility] [place] on [address]. Pt was advised that if she wanted to pick the Rx up in [place], that she'd need to [facility] that [facility] and request that the Rx be transferred to that Pharmacy.

## 2016-09-03 ENCOUNTER — Other Ambulatory Visit: Payer: Self-pay

## 2016-09-03 ENCOUNTER — Encounter: Payer: Self-pay | Admitting: Family Medicine

## 2016-09-03 DIAGNOSIS — F988 Other specified behavioral and emotional disorders with onset usually occurring in childhood and adolescence: Secondary | ICD-10-CM

## 2016-09-03 MED ORDER — AMPHETAMINE-DEXTROAMPHET ER 30 MG PO CP24: ORAL_CAPSULE | ORAL | 0 refills | Status: DC

## 2016-09-03 NOTE — Telephone Encounter (Signed)
Last seen 04/20/16 Last filed 08/03/16 Please advise   Pc

## 2016-09-04 NOTE — Telephone Encounter (Signed)
Medication printed signed and waiting in front office, for patient to pick up. Patient notified via myChart  PC

## 2016-09-10 ENCOUNTER — Telehealth: Payer: Self-pay | Admitting: *Deleted

## 2016-09-10 NOTE — Telephone Encounter (Signed)
Drug screen results received and sent to scan.  

## 2016-09-18 ENCOUNTER — Ambulatory Visit: Payer: Managed Care, Other (non HMO) | Admitting: Family Medicine

## 2016-09-22 ENCOUNTER — Encounter: Payer: Self-pay | Admitting: Family Medicine

## 2016-09-25 ENCOUNTER — Ambulatory Visit (INDEPENDENT_AMBULATORY_CARE_PROVIDER_SITE_OTHER): Payer: Managed Care, Other (non HMO) | Admitting: Family Medicine

## 2016-09-25 ENCOUNTER — Encounter: Payer: Self-pay | Admitting: Family Medicine

## 2016-09-25 VITALS — BP 126/80 | HR 79 | Temp 98.5°F | Resp 16 | Ht 62.0 in | Wt 227.0 lb

## 2016-09-25 DIAGNOSIS — M545 Low back pain, unspecified: Secondary | ICD-10-CM

## 2016-09-25 DIAGNOSIS — F988 Other specified behavioral and emotional disorders with onset usually occurring in childhood and adolescence: Secondary | ICD-10-CM | POA: Diagnosis not present

## 2016-09-25 DIAGNOSIS — D509 Iron deficiency anemia, unspecified: Secondary | ICD-10-CM

## 2016-09-25 DIAGNOSIS — M5441 Lumbago with sciatica, right side: Secondary | ICD-10-CM | POA: Diagnosis not present

## 2016-09-25 DIAGNOSIS — F341 Dysthymic disorder: Secondary | ICD-10-CM

## 2016-09-25 LAB — COMPREHENSIVE METABOLIC PANEL
ALK PHOS: 68 U/L (ref 33–115)
ALT: 23 U/L (ref 6–29)
AST: 17 U/L (ref 10–35)
Albumin: 4.4 g/dL (ref 3.6–5.1)
BILIRUBIN TOTAL: 0.3 mg/dL (ref 0.2–1.2)
BUN: 14 mg/dL (ref 7–25)
CO2: 24 mmol/L (ref 20–31)
CREATININE: 0.57 mg/dL (ref 0.50–1.10)
Calcium: 9.1 mg/dL (ref 8.6–10.2)
Chloride: 103 mmol/L (ref 98–110)
Glucose, Bld: 81 mg/dL (ref 65–99)
Potassium: 4.2 mmol/L (ref 3.5–5.3)
SODIUM: 137 mmol/L (ref 135–146)
Total Protein: 7 g/dL (ref 6.1–8.1)

## 2016-09-25 LAB — LIPID PANEL
CHOL/HDL RATIO: 3.7 ratio (ref <5.0)
Cholesterol: 247 mg/dL — ABNORMAL HIGH (ref <200)
HDL: 67 mg/dL (ref >50)
LDL Cholesterol: 161 mg/dL — ABNORMAL HIGH (ref <100)
Triglycerides: 95 mg/dL (ref <150)
VLDL: 19 mg/dL (ref <30)

## 2016-09-25 LAB — CBC WITH DIFFERENTIAL/PLATELET
BASOS PCT: 0 %
Basophils Absolute: 0 cells/uL (ref 0–200)
EOS ABS: 72 {cells}/uL (ref 15–500)
Eosinophils Relative: 1 %
HCT: 36.8 % (ref 35.0–45.0)
Hemoglobin: 12.4 g/dL (ref 11.7–15.5)
LYMPHS PCT: 38 %
Lymphs Abs: 2736 cells/uL (ref 850–3900)
MCH: 29.8 pg (ref 27.0–33.0)
MCHC: 33.7 g/dL (ref 32.0–36.0)
MCV: 88.5 fL (ref 80.0–100.0)
MONOS PCT: 9 %
MPV: 9 fL (ref 7.5–12.5)
Monocytes Absolute: 648 cells/uL (ref 200–950)
NEUTROS ABS: 3744 {cells}/uL (ref 1500–7800)
Neutrophils Relative %: 52 %
PLATELETS: 322 10*3/uL (ref 140–400)
RBC: 4.16 MIL/uL (ref 3.80–5.10)
RDW: 14.1 % (ref 11.0–15.0)
WBC: 7.2 10*3/uL (ref 3.8–10.8)

## 2016-09-25 LAB — TSH: TSH: 4.45 m[IU]/L

## 2016-09-25 MED ORDER — AMPHETAMINE-DEXTROAMPHET ER 30 MG PO CP24: ORAL_CAPSULE | ORAL | 0 refills | Status: DC

## 2016-09-25 MED ORDER — AMPHETAMINE-DEXTROAMPHET ER 30 MG PO CP24: 60.0000 mg | ORAL_CAPSULE | ORAL | 0 refills | Status: DC

## 2016-09-25 MED ORDER — HYDROCODONE-ACETAMINOPHEN 5-325 MG PO TABS: 1.0000 | ORAL_TABLET | Freq: Four times a day (QID) | ORAL | 0 refills | Status: DC | PRN

## 2016-09-25 MED ORDER — AMPHETAMINE-DEXTROAMPHET ER 30 MG PO CP24
60.0000 mg | ORAL_CAPSULE | ORAL | 0 refills | Status: DC
Start: 2016-09-25 — End: 2017-01-08

## 2016-09-25 MED ORDER — FLUOXETINE HCL 20 MG PO TABS: 20.0000 mg | ORAL_TABLET | Freq: Every day | ORAL | 3 refills | Status: DC

## 2016-09-25 NOTE — Assessment & Plan Note (Signed)
Try chiropractic again Xray ? PT F/u if no better

## 2016-09-25 NOTE — Progress Notes (Signed)
Subjective:    Patient ID: Veronica CrockerMelissa Selner, female    DOB: 07/16/1971, 46 y.o.   MRN: [id number]  Chief Complaint  Patient presents with  . folllow up medication and blood work    HPI Patient is in today for medication follow up for add and med refills.  She c/o of depression/ and fatigue and does not know if is the depression / anxiety and/or add.   She also needs refill pain med for back pain.    Past Medical History:  Diagnosis Date  . Depression   . GERD (gastroesophageal reflux disease)     Past Surgical History:  Procedure Laterality Date  . CESAREAN SECTION    . dnc    . laparascopy    . TONSILLECTOMY    . tubal reversal      Family History  Problem Relation Age of Onset  . Depression    . Bipolar disorder Sister   . Diabetes Mother   . Diabetes Father     Social History   Social History  . Marital status: Married    Spouse name: N/A  . Number of children: N/A  . Years of education: N/A   Occupational History  . [employer]    Social History Main Topics  . Smoking status: Never Smoker  . Smokeless tobacco: Never Used  . Alcohol use No  . Drug use: No  . Sexual activity: Not on file   Other Topics Concern  . Not on file   Social History Narrative  . No narrative on file    Outpatient Medications Prior to Visit  Medication Sig Dispense Refill  . Azelastine-Fluticasone 137-50 MCG/ACT SUSP 1 spray each nostril bid (Patient not taking: Reported on 04/20/2016) 1 Bottle 5  . buPROPion (WELLBUTRIN XL) 150 MG 24 hr tablet Take 2 tablets (300 mg total) by mouth every morning. 180 tablet 1  . clindamycin (CLEOCIN T) 1 % lotion APPLY TOPICALLY 2 TIMES DAILY. 60 mL 5  . clonazePAM (KLONOPIN) 1 MG tablet TAKE 1 TABLET BY MOUTH TWICE A DAY 60 tablet 0  . eszopiclone (LUNESTA) 2 MG TABS tablet Take 1 tablet (2 mg total) by mouth at bedtime as needed for sleep. Take immediately before bedtime 30 tablet 0  . levocetirizine (XYZAL) 5 MG tablet Take  1 tablet (5 mg total) by mouth every evening. 90 tablet 3  . meloxicam (MOBIC) 15 MG tablet TAKE 1 TABLET BY MOUTH EVERY DAY 30 tablet 2  . pantoprazole (PROTONIX) 40 MG tablet Take 1 tablet (40 mg total) by mouth daily. 90 tablet 1  . sertraline (ZOLOFT) 100 MG tablet TAKE 2 TABLETS BY MOUTH EVERY DAY 180 tablet 2  . amphetamine-dextroamphetamine (ADDERALL XR) 30 MG 24 hr capsule 2 po qam 60 capsule 0  . amphetamine-dextroamphetamine (ADDERALL XR) 30 MG 24 hr capsule 2 po qd 60 capsule 0  . amphetamine-dextroamphetamine (ADDERALL XR) 30 MG 24 hr capsule 2 po qd 60 capsule 0  . HYDROcodone-acetaminophen (NORCO/VICODIN) 5-325 MG tablet Take 1 tablet by mouth every 6 (six) hours as needed for moderate pain. 45 tablet 0  . methocarbamol (ROBAXIN) 500 MG tablet 1 -2 po tid prn (Patient not taking: Reported on 04/20/2016) 45 tablet 1   No facility-administered medications prior to visit.     Allergies  Allergen Reactions  . Penicillins     Unknown--reaction as an infant    Review of Systems  Constitutional: Negative for fever and malaise/fatigue.  HENT: Negative for  congestion.   Eyes: Negative for blurred vision.  Respiratory: Negative for cough and shortness of breath.   Cardiovascular: Negative for chest pain, palpitations and leg swelling.  Gastrointestinal: Negative for vomiting.  Musculoskeletal: Negative for back pain.  Skin: Negative for rash.  Neurological: Negative for loss of consciousness and headaches.       Objective:    Physical Exam  Constitutional: She is oriented to person, place, and time. She appears well-developed and well-nourished. No distress.  HENT:  Head: Normocephalic and atraumatic.  Eyes: Conjunctivae are normal.  Neck: Normal range of motion. No thyromegaly present.  Cardiovascular: Normal rate and regular rhythm.   Pulmonary/Chest: Effort normal and breath sounds normal. She has no wheezes.  Abdominal: Soft. Bowel sounds are normal. There is no  tenderness.  Musculoskeletal: Normal range of motion. She exhibits no edema or deformity.  Neurological: She is alert and oriented to person, place, and time.  Skin: Skin is warm and dry. She is not diaphoretic.  Psychiatric: She has a normal mood and affect.    BP 126/80 (BP Location: Right Arm, Cuff Size: Large)   Pulse 79   Temp 98.5 F (36.9 C) (Oral)   Resp 16   Ht 5\' 2"  (1.575 m)   Wt 227 lb (103 kg)   LMP 09/16/2016   SpO2 98%   BMI 41.52 kg/m  Wt Readings from Last 3 Encounters:  09/25/16 227 lb (103 kg)  04/20/16 217 lb 6.4 oz (98.6 kg)  02/27/16 208 lb (94.3 kg)     Lab Results  Component Value Date   WBC 7.2 09/25/2016   HGB 12.4 09/25/2016   HCT 36.8 09/25/2016   PLT 322 09/25/2016   GLUCOSE 81 09/25/2016   CHOL 247 (H) 09/25/2016   TRIG 95 09/25/2016   HDL 67 09/25/2016   LDLCALC 161 (H) 09/25/2016   ALT 23 09/25/2016   AST 17 09/25/2016   NA 137 09/25/2016   K 4.2 09/25/2016   CL 103 09/25/2016   CREATININE 0.57 09/25/2016   BUN 14 09/25/2016   CO2 24 09/25/2016   TSH 4.45 09/25/2016    Lab Results  Component Value Date   TSH 4.45 09/25/2016   Lab Results  Component Value Date   WBC 7.2 09/25/2016   HGB 12.4 09/25/2016   HCT 36.8 09/25/2016   MCV 88.5 09/25/2016   PLT 322 09/25/2016   Lab Results  Component Value Date   NA 137 09/25/2016   K 4.2 09/25/2016   CO2 24 09/25/2016   GLUCOSE 81 09/25/2016   BUN 14 09/25/2016   CREATININE 0.57 09/25/2016   BILITOT 0.3 09/25/2016   ALKPHOS 68 09/25/2016   AST 17 09/25/2016   ALT 23 09/25/2016   PROT 7.0 09/25/2016   ALBUMIN 4.4 09/25/2016   CALCIUM 9.1 09/25/2016   GFR 120.91 04/20/2016   Lab Results  Component Value Date   CHOL 247 (H) 09/25/2016   Lab Results  Component Value Date   HDL 67 09/25/2016   Lab Results  Component Value Date   LDLCALC 161 (H) 09/25/2016   Lab Results  Component Value Date   TRIG 95 09/25/2016   Lab Results  Component Value Date   CHOLHDL  3.7 09/25/2016   No results found for: HGBA1C    I acted as a Neurosurgeon for Dr. Zola Button.  Apolonio Schneiders, CMA.  Assessment & Plan:   Problem List Items Addressed This Visit      Unprioritized   ADD (attention deficit  disorder)    con't meds rto 6 months      Relevant Medications   amphetamine-dextroamphetamine (ADDERALL XR) 30 MG 24 hr capsule   amphetamine-dextroamphetamine (ADDERALL XR) 30 MG 24 hr capsule   amphetamine-dextroamphetamine (ADDERALL XR) 30 MG 24 hr capsule   DEPRESSION/ANXIETY    Wean off med--zoloft and wellbutrin Start prozac F/u 1 month Name and number given for [facility] for testing with psychologist      Relevant Medications   FLUoxetine (PROZAC) 20 MG tablet    Other Visit Diagnoses    Iron deficiency anemia, unspecified iron deficiency anemia type    -  Primary   Relevant Orders   CBC with Differential/Platelet (Completed)   Comprehensive metabolic panel (Completed)   Lipid panel (Completed)   TSH (Completed)   Low back pain without sciatica, unspecified back pain laterality, unspecified chronicity       Relevant Medications   HYDROcodone-acetaminophen (NORCO/VICODIN) 5-325 MG tablet   Other Relevant Orders   DG Lumbar Spine 2-3 Views   Right-sided low back pain with right-sided sciatica, unspecified chronicity       Relevant Medications   HYDROcodone-acetaminophen (NORCO/VICODIN) 5-325 MG tablet      I have discontinued Ms. Law's methocarbamol. I have also changed her amphetamine-dextroamphetamine and amphetamine-dextroamphetamine. Additionally, I am having her start on FLUoxetine. Lastly, I am having her maintain her clindamycin, eszopiclone, Azelastine-Fluticasone, sertraline, levocetirizine, buPROPion, meloxicam, clonazePAM, pantoprazole, amphetamine-dextroamphetamine, and HYDROcodone-acetaminophen.  Meds ordered this encounter  Medications  . FLUoxetine (PROZAC) 20 MG tablet    Sig: Take 1 tablet (20 mg total) by mouth daily.      Dispense:  30 tablet    Refill:  3  . amphetamine-dextroamphetamine (ADDERALL XR) 30 MG 24 hr capsule    Sig: 2 po qam    Dispense:  60 capsule    Refill:  0    Do not fill until 10/26/16  . amphetamine-dextroamphetamine (ADDERALL XR) 30 MG 24 hr capsule    Sig: Take 2 capsules (60 mg total) by mouth every morning.    Dispense:  60 capsule    Refill:  0    Do not fill until 11/22/16  . amphetamine-dextroamphetamine (ADDERALL XR) 30 MG 24 hr capsule    Sig: Take 2 capsules (60 mg total) by mouth every morning.    Dispense:  60 capsule    Refill:  0  . HYDROcodone-acetaminophen (NORCO/VICODIN) 5-325 MG tablet    Sig: Take 1 tablet by mouth every 6 (six) hours as needed for moderate pain.    Dispense:  45 tablet    Refill:  0    CMA served as scribe during this visit. History, Physical and Plan performed by medical provider. Documentation and orders reviewed and attested to.   Donato Schultz, DO

## 2016-09-25 NOTE — Progress Notes (Signed)
Pre visit review using our clinic review tool, if applicable. No additional management support is needed unless otherwise documented below in the visit note. 

## 2016-09-25 NOTE — Patient Instructions (Addendum)
Wellbutrin take 1 tablet daily for 1 week then stop, Zoloft take 1 tablet daily for 1 week, then 1/2 tablet for 1 week then stop.  When you get to the 1/2 tab of Zoloft start taking the fluoxetine (Prozac).   Back Pain, Adult Back pain is very common in adults.The cause of back pain is rarely dangerous and the pain often gets better over time.The cause of your back pain may not be known. Some common causes of back pain include:  Strain of the muscles or ligaments supporting the spine.  Wear and tear (degeneration) of the spinal disks.  Arthritis.  Direct injury to the back. For many people, back pain may return. Since back pain is rarely dangerous, most people can learn to manage this condition on their own. Follow these instructions at home: Watch your back pain for any changes. The following actions may help to lessen any discomfort you are feeling:  Remain active. It is stressful on your back to sit or stand in one place for long periods of time. Do not sit, drive, or stand in one place for more than 30 minutes at a time. Take short walks on even surfaces as soon as you are able.Try to increase the length of time you walk each day.  Exercise regularly as directed by your health care provider. Exercise helps your back heal faster. It also helps avoid future injury by keeping your muscles strong and flexible.  Do not stay in bed.Resting more than 1-2 days can delay your recovery.  Pay attention to your body when you bend and lift. The most comfortable positions are those that put less stress on your recovering back. Always use proper lifting techniques, including:  Bending your knees.  Keeping the load close to your body.  Avoiding twisting.  Find a comfortable position to sleep. Use a firm mattress and lie on your side with your knees slightly bent. If you lie on your back, put a pillow under your knees.  Avoid feeling anxious or stressed.Stress increases muscle tension and  can worsen back pain.It is important to recognize when you are anxious or stressed and learn ways to manage it, such as with exercise.  Take medicines only as directed by your health care provider. Over-the-counter medicines to reduce pain and inflammation are often the most helpful.Your health care provider may prescribe muscle relaxant drugs.These medicines help dull your pain so you can more quickly return to your normal activities and healthy exercise.  Apply ice to the injured area:  Put ice in a plastic bag.  Place a towel between your skin and the bag.  Leave the ice on for 20 minutes, 2-3 times a day for the first 2-3 days. After that, ice and heat may be alternated to reduce pain and spasms.  Maintain a healthy weight. Excess weight puts extra stress on your back and makes it difficult to maintain good posture. Contact a health care provider if:  You have pain that is not relieved with rest or medicine.  You have increasing pain going down into the legs or buttocks.  You have pain that does not improve in one week.  You have night pain.  You lose weight.  You have a fever or chills. Get help right away if:  You develop new bowel or bladder control problems.  You have unusual weakness or numbness in your arms or legs.  You develop nausea or vomiting.  You develop abdominal pain.  You feel faint. This information  is not intended to replace advice given to you by your health care provider. Make sure you discuss any questions you have with your health care provider. Document Released: 09/07/2005 Document Revised: 01/16/2016 Document Reviewed: 01/09/2014 Elsevier Interactive Patient Education  2017 [employer].

## 2016-09-26 NOTE — Assessment & Plan Note (Signed)
con't meds rto 6 months

## 2016-09-26 NOTE — Assessment & Plan Note (Signed)
Wean off med--zoloft and wellbutrin Start prozac F/u 1 month Name and number given for [facility] for testing with psychologist

## 2016-09-28 ENCOUNTER — Encounter: Payer: Self-pay | Admitting: Family Medicine

## 2016-10-02 ENCOUNTER — Other Ambulatory Visit: Payer: Self-pay | Admitting: Family Medicine

## 2016-10-08 ENCOUNTER — Ambulatory Visit: Payer: Self-pay | Admitting: Neurology

## 2016-10-09 ENCOUNTER — Telehealth: Payer: Self-pay

## 2016-10-09 NOTE — Telephone Encounter (Signed)
I called pt to get her rescheduled. No answer, left a message asking her to call me back. If pt calls back, please get her rescheduled with Dr. Vickey Hugerohmeier.

## 2016-10-09 NOTE — Telephone Encounter (Signed)
I called pt to advise her that her appt will need to be rescheduled. No answer. I left a VM advising her that our office will be closed on 10/08/16 and we will call back to get her rescheduled.

## 2016-10-13 ENCOUNTER — Encounter: Payer: Self-pay | Admitting: Family Medicine

## 2016-10-13 NOTE — Telephone Encounter (Signed)
Order was put in , she just needs to go get it

## 2016-10-14 ENCOUNTER — Other Ambulatory Visit: Payer: Self-pay | Admitting: Family Medicine

## 2016-10-18 ENCOUNTER — Other Ambulatory Visit: Payer: Self-pay | Admitting: Family Medicine

## 2016-10-18 DIAGNOSIS — R1013 Epigastric pain: Secondary | ICD-10-CM

## 2016-10-26 ENCOUNTER — Encounter: Payer: Self-pay | Admitting: Family Medicine

## 2016-10-27 ENCOUNTER — Encounter: Payer: Self-pay | Admitting: Family Medicine

## 2016-10-27 ENCOUNTER — Ambulatory Visit (INDEPENDENT_AMBULATORY_CARE_PROVIDER_SITE_OTHER): Payer: Managed Care, Other (non HMO) | Admitting: Family Medicine

## 2016-10-27 VITALS — BP 120/80 | HR 83 | Temp 98.8°F | Resp 16 | Ht 62.0 in | Wt 215.6 lb

## 2016-10-27 DIAGNOSIS — Z23 Encounter for immunization: Secondary | ICD-10-CM

## 2016-10-27 DIAGNOSIS — M5441 Lumbago with sciatica, right side: Secondary | ICD-10-CM

## 2016-10-27 MED ORDER — HYDROCODONE-ACETAMINOPHEN 5-325 MG PO TABS: 1.0000 | ORAL_TABLET | Freq: Four times a day (QID) | ORAL | 0 refills | Status: DC | PRN

## 2016-10-27 NOTE — Progress Notes (Signed)
Pre visit review using our clinic review tool, if applicable. No additional management support is needed unless otherwise documented below in the visit note. 

## 2016-10-27 NOTE — Progress Notes (Signed)
Subjective:  I acted as a Neurosurgeon for Dr. Zola Button.  Apolonio Schneiders, CMA   Patient ID: Veronica Yates, female    DOB: 03/01/1971, 46 y.o.   MRN: [id number]  Chief Complaint  Patient presents with  . Follow-up    started prozac on 09/25/16    HPI Patient is in today for follow up medication.  Started on Prozac a month ago. Pt states she has noticed feeling agitated in afternoon.  She feels fine in am --noticed this last 2 weeks   Past Medical History:  Diagnosis Date  . Depression   . GERD (gastroesophageal reflux disease)     Past Surgical History:  Procedure Laterality Date  . CESAREAN SECTION    . dnc    . laparascopy    . TONSILLECTOMY    . tubal reversal      Family History  Problem Relation Age of Onset  . Depression    . Bipolar disorder Sister   . Diabetes Mother   . Diabetes Father     Social History   Social History  . Marital status: Married    Spouse name: N/A  . Number of children: N/A  . Years of education: N/A   Occupational History  . [employer]    Social History Main Topics  . Smoking status: Never Smoker  . Smokeless tobacco: Never Used  . Alcohol use No  . Drug use: No  . Sexual activity: Not on file   Other Topics Concern  . Not on file   Social History Narrative  . No narrative on file    Outpatient Medications Prior to Visit  Medication Sig Dispense Refill  . amphetamine-dextroamphetamine (ADDERALL XR) 30 MG 24 hr capsule 2 po qam 60 capsule 0  . amphetamine-dextroamphetamine (ADDERALL XR) 30 MG 24 hr capsule Take 2 capsules (60 mg total) by mouth every morning. 60 capsule 0  . amphetamine-dextroamphetamine (ADDERALL XR) 30 MG 24 hr capsule Take 2 capsules (60 mg total) by mouth every morning. 60 capsule 0  . clindamycin (CLEOCIN T) 1 % lotion APPLY TOPICALLY 2 TIMES DAILY. 60 mL 1  . clonazePAM (KLONOPIN) 1 MG tablet TAKE 1 TABLET BY MOUTH TWICE A DAY 60 tablet 0  . eszopiclone (LUNESTA) 2 MG TABS tablet Take 1  tablet (2 mg total) by mouth at bedtime as needed for sleep. Take immediately before bedtime 30 tablet 0  . FLUoxetine (PROZAC) 20 MG tablet Take 1 tablet (20 mg total) by mouth daily. 30 tablet 3  . levocetirizine (XYZAL) 5 MG tablet Take 1 tablet (5 mg total) by mouth every evening. 90 tablet 3  . meloxicam (MOBIC) 15 MG tablet TAKE 1 TABLET BY MOUTH EVERY DAY 30 tablet 0  . pantoprazole (PROTONIX) 40 MG tablet TAKE 1 TABLET (40 MG TOTAL) BY MOUTH DAILY. 30 tablet 1  . HYDROcodone-acetaminophen (NORCO/VICODIN) 5-325 MG tablet Take 1 tablet by mouth every 6 (six) hours as needed for moderate pain. 45 tablet 0  . Azelastine-Fluticasone 137-50 MCG/ACT SUSP 1 spray each nostril bid (Patient not taking: Reported on 04/20/2016) 1 Bottle 5  . buPROPion (WELLBUTRIN XL) 150 MG 24 hr tablet TAKE 2 TABLETS BY MOUTH EVERY MORNING (Patient not taking: Reported on 10/27/2016) 180 tablet 1  . sertraline (ZOLOFT) 100 MG tablet TAKE 2 TABLETS BY MOUTH EVERY DAY 180 tablet 2   No facility-administered medications prior to visit.     Allergies  Allergen Reactions  . Penicillins     Unknown--reaction as  an infant    Review of Systems  Constitutional: Negative for chills, fever and malaise/fatigue.  HENT: Negative for congestion and hearing loss.   Eyes: Negative for discharge.  Respiratory: Negative for cough, sputum production and shortness of breath.   Cardiovascular: Negative for chest pain, palpitations and leg swelling.  Gastrointestinal: Negative for abdominal pain, blood in stool, constipation, diarrhea, heartburn, nausea and vomiting.  Genitourinary: Negative for dysuria, frequency, hematuria and urgency.  Musculoskeletal: Negative for back pain, falls and myalgias.  Skin: Negative for rash.  Neurological: Negative for dizziness, sensory change, loss of consciousness, weakness and headaches.  Endo/Heme/Allergies: Negative for environmental allergies. Does not bruise/bleed easily.    Psychiatric/Behavioral: Negative for depression and suicidal ideas. The patient is not nervous/anxious and does not have insomnia.        Objective:    Physical Exam  Constitutional: She is oriented to person, place, and time. She appears well-developed and well-nourished. No distress.  HENT:  Right Ear: External ear normal.  Left Ear: External ear normal.  Nose: Nose normal.  Mouth/Throat: Oropharynx is clear and moist.  Eyes: EOM are normal. Pupils are equal, round, and reactive to light.  Neck: Normal range of motion. Neck supple.  Cardiovascular: Normal rate, regular rhythm and normal heart sounds.   No murmur heard. Pulmonary/Chest: Effort normal and breath sounds normal. No respiratory distress. She has no wheezes. She has no rales. She exhibits no tenderness.  Neurological: She is alert and oriented to person, place, and time.  Psychiatric: She has a normal mood and affect. Her behavior is normal. Judgment and thought content normal.  Nursing note and vitals reviewed.   BP 120/80 (BP Location: Left Arm, Cuff Size: Large)   Pulse 83   Temp 98.8 F (37.1 C) (Oral)   Resp 16   Ht 5\' 2"  (1.575 m)   Wt 215 lb 9.6 oz (97.8 kg)   LMP 10/04/2016   SpO2 98%   BMI 39.43 kg/m  Wt Readings from Last 3 Encounters:  10/27/16 215 lb 9.6 oz (97.8 kg)  09/25/16 227 lb (103 kg)  04/20/16 217 lb 6.4 oz (98.6 kg)     Lab Results  Component Value Date   WBC 7.2 09/25/2016   HGB 12.4 09/25/2016   HCT 36.8 09/25/2016   PLT 322 09/25/2016   GLUCOSE 81 09/25/2016   CHOL 247 (H) 09/25/2016   TRIG 95 09/25/2016   HDL 67 09/25/2016   LDLCALC 161 (H) 09/25/2016   ALT 23 09/25/2016   AST 17 09/25/2016   NA 137 09/25/2016   K 4.2 09/25/2016   CL 103 09/25/2016   CREATININE 0.57 09/25/2016   BUN 14 09/25/2016   CO2 24 09/25/2016   TSH 4.45 09/25/2016    Lab Results  Component Value Date   TSH 4.45 09/25/2016   Lab Results  Component Value Date   WBC 7.2 09/25/2016   HGB  12.4 09/25/2016   HCT 36.8 09/25/2016   MCV 88.5 09/25/2016   PLT 322 09/25/2016   Lab Results  Component Value Date   NA 137 09/25/2016   K 4.2 09/25/2016   CO2 24 09/25/2016   GLUCOSE 81 09/25/2016   BUN 14 09/25/2016   CREATININE 0.57 09/25/2016   BILITOT 0.3 09/25/2016   ALKPHOS 68 09/25/2016   AST 17 09/25/2016   ALT 23 09/25/2016   PROT 7.0 09/25/2016   ALBUMIN 4.4 09/25/2016   CALCIUM 9.1 09/25/2016   GFR 120.91 04/20/2016   Lab Results  Component Value Date   CHOL 247 (H) 09/25/2016   Lab Results  Component Value Date   HDL 67 09/25/2016   Lab Results  Component Value Date   LDLCALC 161 (H) 09/25/2016   Lab Results  Component Value Date   TRIG 95 09/25/2016   Lab Results  Component Value Date   CHOLHDL 3.7 09/25/2016   No results found for: HGBA1C     Assessment & Plan:   Problem List Items Addressed This Visit    None    Visit Diagnoses    Influenza vaccine administered    -  Primary   Relevant Orders   Flu Vaccine QUAD 36+ mos IM (Fluarix & Fluzone Quad PF (Completed)   Need for diphtheria-tetanus-pertussis (Tdap) vaccine       Relevant Orders   Tdap vaccine greater than or equal to 7yo IM (Completed)   Right-sided low back pain with right-sided sciatica, unspecified chronicity       Relevant Medications   HYDROcodone-acetaminophen (NORCO/VICODIN) 5-325 MG tablet      I have discontinued Ms. Howell's Azelastine-Fluticasone, sertraline, and buPROPion. I am also having her maintain her eszopiclone, levocetirizine, clonazePAM, FLUoxetine, amphetamine-dextroamphetamine, amphetamine-dextroamphetamine, amphetamine-dextroamphetamine, clindamycin, meloxicam, pantoprazole, and HYDROcodone-acetaminophen.  Meds ordered this encounter  Medications  . HYDROcodone-acetaminophen (NORCO/VICODIN) 5-325 MG tablet    Sig: Take 1 tablet by mouth every 6 (six) hours as needed for moderate pain.    Dispense:  45 tablet    Refill:  0    CMA served as  scribe during this visit. History, Physical and Plan performed by medical provider. Documentation and orders reviewed and attested to.   Donato Schultz, DO

## 2016-10-27 NOTE — Patient Instructions (Addendum)
Try taking 2 fluoxetine daily.   If it works please call for new prescription before running out.     Tdap Vaccine (Tetanus, Diphtheria and Pertussis): What You Need to Know 1. Why get vaccinated? Tetanus, diphtheria and pertussis are very serious diseases. Tdap vaccine can protect [place] from these diseases. And, Tdap vaccine given to pregnant women can protect newborn babies against pertussis. TETANUS (Lockjaw) is rare in the [place] today. It causes painful muscle tightening and stiffness, usually all over the body.  It can lead to tightening of muscles in the head and neck so you can't open your mouth, swallow, or sometimes even breathe. Tetanus kills about 1 out of 10 people who are infected even after receiving the best medical care. DIPHTHERIA is also rare in the [place] today. It can cause a thick coating to form in the back of the throat.  It can lead to breathing problems, heart failure, paralysis, and death. PERTUSSIS (Whooping Cough) causes severe coughing spells, which can cause difficulty breathing, vomiting and disturbed sleep.  It can also lead to weight loss, incontinence, and rib fractures. Up to 2 in 100 adolescents and 5 in 100 adults with pertussis are hospitalized or have complications, which could include pneumonia or death. These diseases are caused by bacteria. Diphtheria and pertussis are spread from person to person through secretions from coughing or sneezing. Tetanus enters the body through cuts, scratches, or wounds. Before vaccines, as many as 200,000 cases of diphtheria, 200,000 cases of pertussis, and hundreds of cases of tetanus, were reported in the [place] each year. Since vaccination began, reports of cases for tetanus and diphtheria have dropped by about 99% and for pertussis by about 80%. 2. Tdap vaccine Tdap vaccine can protect adolescents and adults from tetanus, diphtheria, and pertussis. One dose of Tdap is routinely given at age 26 or 84.  People who did not get Tdap at that age should get it as soon as possible. Tdap is especially important for healthcare professionals and anyone having close contact with a baby younger than 12 months. Pregnant women should get a dose of Tdap during every pregnancy, to protect the newborn from pertussis. Infants are most at risk for severe, life-threatening complications from pertussis. Another vaccine, called Td, protects against tetanus and diphtheria, but not pertussis. A Td booster should be given every 10 years. Tdap may be given as one of these boosters if you have never gotten Tdap before. Tdap may also be given after a severe cut or burn to prevent tetanus infection. Your doctor or the person giving you the vaccine can give you more information. Tdap may safely be given at the same time as other vaccines. 3. Some people should not get this vaccine  A person who has ever had a life-threatening allergic reaction after a previous dose of any diphtheria, tetanus or pertussis containing vaccine, OR has a severe allergy to any part of this vaccine, should not get Tdap vaccine. Tell the person giving the vaccine about any severe allergies.  Anyone who had coma or long repeated seizures within 7 days after a childhood dose of DTP or DTaP, or a previous dose of Tdap, should not get Tdap, unless a cause other than the vaccine was found. They can still get Td.  Talk to your doctor if you:  have seizures or another nervous system problem,  had severe pain or swelling after any vaccine containing diphtheria, tetanus or pertussis,  ever had a condition called Guillain-Barr  Syndrome (GBS),  aren't feeling well on the day the shot is scheduled. 4. Risks With any medicine, including vaccines, there is a chance of side effects. These are usually mild and go away on their own. Serious reactions are also possible but are rare. Most people who get Tdap vaccine do not have any problems with it. Mild  problems following Tdap: (Did not interfere with activities)  Pain where the shot was given (about 3 in 4 adolescents or 2 in 3 adults)  Redness or swelling where the shot was given (about 1 person in 5)  Mild fever of at least 100.96F (up to about 1 in 25 adolescents or 1 in 100 adults)  Headache (about 3 or 4 people in 10)  Tiredness (about 1 person in 3 or 4)  Nausea, vomiting, diarrhea, stomach ache (up to 1 in 4 adolescents or 1 in 10 adults)  Chills, sore joints (about 1 person in 10)  Body aches (about 1 person in 3 or 4)  Rash, swollen glands (uncommon) Moderate problems following Tdap: (Interfered with activities, but did not require medical attention)  Pain where the shot was given (up to 1 in 5 or 6)  Redness or swelling where the shot was given (up to about 1 in 16 adolescents or 1 in 12 adults)  Fever over 102F (about 1 in 100 adolescents or 1 in 250 adults)  Headache (about 1 in 7 adolescents or 1 in 10 adults)  Nausea, vomiting, diarrhea, stomach ache (up to 1 or 3 people in 100)  Swelling of the entire arm where the shot was given (up to about 1 in 500). Severe problems following Tdap: (Unable to perform usual activities; required medical attention)  Swelling, severe pain, bleeding and redness in the arm where the shot was given (rare). Problems that could happen after any vaccine:  People sometimes faint after a medical procedure, including vaccination. Sitting or lying down for about 15 minutes can help prevent fainting, and injuries caused by a fall. Tell your doctor if you feel dizzy, or have vision changes or ringing in the ears.  Some people get severe pain in the shoulder and have difficulty moving the arm where a shot was given. This happens very rarely.  Any medication can cause a severe allergic reaction. Such reactions from a vaccine are very rare, estimated at fewer than 1 in a million doses, and would happen within a few minutes to a few hours  after the vaccination. As with any medicine, there is a very remote chance of a vaccine causing a serious injury or death. The safety of vaccines is always being monitored. For more information, visit: http://floyd.org/ 5. What if there is a serious problem? What should I look for? Look for anything that concerns you, such as signs of a severe allergic reaction, very high fever, or unusual behavior. Signs of a severe allergic reaction can include hives, swelling of the face and throat, difficulty breathing, a fast heartbeat, dizziness, and weakness. These would usually start a few minutes to a few hours after the vaccination. What should I do?  If you think it is a severe allergic reaction or other emergency that can't wait, call 9-1-1 or get the person to the nearest hospital. Otherwise, call your doctor.  Afterward, the reaction should be reported to the Vaccine Adverse Event Reporting System (VAERS). Your doctor might file this report, or you can do it yourself through the VAERS web site at www.vaers.LAgents.no, or by calling 1-612-179-5491.  VAERS does not give medical advice. 6. The National Vaccine Injury Compensation Program The [employer] Vaccine Injury Compensation Program (VICP) is a federal program that was created to compensate people who may have been injured by certain vaccines. Persons who believe they may have been injured by a vaccine can learn about the program and about filing a claim by calling 1-(713)741-9055 or visiting the VICP website at SpiritualWord.atwww.hrsa.gov/vaccinecompensation. There is a time limit to file a claim for compensation. 7. How can I learn more?  Ask your doctor. He or she can give you the vaccine package insert or suggest other sources of information.  Call your local or state health department.  Contact the Centers for Disease Control and Prevention (CDC):  Call 320-201-26991-332-347-9334 (1-800-CDC-INFO) or  Visit CDC's website at PicCapture.uywww.cdc.gov/vaccines CDC Tdap  Vaccine VIS (11/14/13) This information is not intended to replace advice given to you by your health care provider. Make sure you discuss any questions you have with your health care provider. Document Released: 03/08/2012 Document Revised: 05/28/2016 Document Reviewed: 05/28/2016 Elsevier Interactive Patient Education  2017 Elsevier Inc.       Influenza (Flu) Vaccine (Inactivated or Recombinant): What You Need to Know 1. Why get vaccinated? Influenza ("flu") is a contagious disease that spreads around the [place] every year, usually between October and May. Flu is caused by influenza viruses, and is spread mainly by coughing, sneezing, and close contact. Anyone can get flu. Flu strikes suddenly and can last several days. Symptoms vary by age, but can include:  fever/chills  sore throat  muscle aches  fatigue  cough  headache  runny or stuffy nose Flu can also lead to pneumonia and blood infections, and cause diarrhea and seizures in children. If you have a medical condition, such as heart or lung disease, flu can make it worse. Flu is more dangerous for some people. Infants and young children, people 48 years of age and older, pregnant women, and people with certain health conditions or a weakened immune system are at greatest risk. Each year thousands of people in the [place] die from flu, and many more are hospitalized. Flu vaccine can:  keep you from getting flu,  make flu less severe if you do get it, and  keep you from spreading flu to your family and other people. 2. Inactivated and recombinant flu vaccines A dose of flu vaccine is recommended every flu season. Children 6 months through 408 years of age may need two doses during the same flu season. Everyone else needs only one dose each flu season. Some inactivated flu vaccines contain a very small amount of a mercury-based preservative called thimerosal. Studies have not shown thimerosal in vaccines to be  harmful, but flu vaccines that do not contain thimerosal are available. There is no live flu virus in flu shots. They cannot cause the flu. There are many flu viruses, and they are always changing. Each year a new flu vaccine is made to protect against three or four viruses that are likely to cause disease in the upcoming flu season. But even when the vaccine doesn't exactly match these viruses, it may still provide some protection. Flu vaccine cannot prevent:  flu that is caused by a virus not covered by the vaccine, or  illnesses that look like flu but are not. It takes about 2 weeks for protection to develop after vaccination, and protection lasts through the flu season. 3. Some people should not get this vaccine Tell the person who is  giving you the vaccine:  If you have any severe, life-threatening allergies. If you ever had a life-threatening allergic reaction after a dose of flu vaccine, or have a severe allergy to any part of this vaccine, you may be advised not to get vaccinated. Most, but not all, types of flu vaccine contain a small amount of egg protein.  If you ever had Guillain-Barr Syndrome (also called GBS). Some people with a history of GBS should not get this vaccine. This should be discussed with your doctor.  If you are not feeling well. It is usually okay to get flu vaccine when you have a mild illness, but you might be asked to come back when you feel better. 4. Risks of a vaccine reaction With any medicine, including vaccines, there is a chance of reactions. These are usually mild and go away on their own, but serious reactions are also possible. Most people who get a flu shot do not have any problems with it. Minor problems following a flu shot include:  soreness, redness, or swelling where the shot was given  hoarseness  sore, red or itchy eyes  cough  fever  aches  headache  itching  fatigue If these problems occur, they usually begin soon after the  shot and last 1 or 2 days. More serious problems following a flu shot can include the following:  There may be a small increased risk of Guillain-Barre Syndrome (GBS) after inactivated flu vaccine. This risk has been estimated at 1 or 2 additional cases per million people vaccinated. This is much lower than the risk of severe complications from flu, which can be prevented by flu vaccine.  Young children who get the flu shot along with pneumococcal vaccine (PCV13) and/or DTaP vaccine at the same time might be slightly more likely to have a seizure caused by fever. Ask your doctor for more information. Tell your doctor if a child who is getting flu vaccine has ever had a seizure. Problems that could happen after any injected vaccine:  People sometimes faint after a medical procedure, including vaccination. Sitting or lying down for about 15 minutes can help prevent fainting, and injuries caused by a fall. Tell your doctor if you feel dizzy, or have vision changes or ringing in the ears.  Some people get severe pain in the shoulder and have difficulty moving the arm where a shot was given. This happens very rarely.  Any medication can cause a severe allergic reaction. Such reactions from a vaccine are very rare, estimated at about 1 in a million doses, and would happen within a few minutes to a few hours after the vaccination. As with any medicine, there is a very remote chance of a vaccine causing a serious injury or death. The safety of vaccines is always being monitored. For more information, visit: http://floyd.org/ 5. What if there is a serious reaction? What should I look for? Look for anything that concerns you, such as signs of a severe allergic reaction, very high fever, or unusual behavior. Signs of a severe allergic reaction can include hives, swelling of the face and throat, difficulty breathing, a fast heartbeat, dizziness, and weakness. These would start a few minutes to a few  hours after the vaccination. What should I do?  If you think it is a severe allergic reaction or other emergency that can't wait, call 9-1-1 and get the person to the nearest hospital. Otherwise, call your doctor.  Reactions should be reported to the Vaccine Adverse Event  Reporting System (VAERS). Your doctor should file this report, or you can do it yourself through the VAERS web site at www.vaers.LAgents.no, or by calling 1-331-277-1482.  VAERS does not give medical advice. 6. The National Vaccine Injury Compensation Program The [employer] Vaccine Injury Compensation Program (VICP) is a federal program that was created to compensate people who may have been injured by certain vaccines. Persons who believe they may have been injured by a vaccine can learn about the program and about filing a claim by calling 1-760-202-4135 or visiting the VICP website at SpiritualWord.at. There is a time limit to file a claim for compensation. 7. How can I learn more?  Ask your healthcare provider. He or she can give you the vaccine package insert or suggest other sources of information.  Call your local or state health department.  Contact the Centers for Disease Control and Prevention (CDC):  Call (407) 024-6886 (1-800-CDC-INFO) or  Visit CDC's website at BiotechRoom.com.cy Vaccine Information Statement, Inactivated Influenza Vaccine (04/27/2014) This information is not intended to replace advice given to you by your health care provider. Make sure you discuss any questions you have with your health care provider. Document Released: 07/02/2006 Document Revised: 05/28/2016 Document Reviewed: 05/28/2016 Elsevier Interactive Patient Education  2017 Elsevier Inc. Generalized Anxiety Disorder Generalized anxiety disorder (GAD) is a mental disorder. It interferes with life functions, including relationships, work, and school. GAD is different from normal anxiety, which everyone experiences at some  point in their lives in response to specific life events and activities. Normal anxiety actually helps [place] prepare for and get through these life events and activities. Normal anxiety goes away after the event or activity is over.  GAD causes anxiety that is not necessarily related to specific events or activities. It also causes excess anxiety in proportion to specific events or activities. The anxiety associated with GAD is also difficult to control. GAD can vary from mild to severe. People with severe GAD can have intense waves of anxiety with physical symptoms (panic attacks).  SYMPTOMS The anxiety and worry associated with GAD are difficult to control. This anxiety and worry are related to many life events and activities and also occur more days than not for 6 months or longer. People with GAD also have three or more of the following symptoms (one or more in children):  Restlessness.   Fatigue.  Difficulty concentrating.   Irritability.  Muscle tension.  Difficulty sleeping or unsatisfying sleep. DIAGNOSIS GAD is diagnosed through an assessment by your health care provider. Your health care provider will ask you questions aboutyour mood,physical symptoms, and events in your life. Your health care provider may ask you about your medical history and use of alcohol or drugs, including prescription medicines. Your health care provider may also do a physical exam and blood tests. Certain medical conditions and the use of certain substances can cause symptoms similar to those associated with GAD. Your health care provider may refer you to a mental health specialist for further evaluation. TREATMENT The following therapies are usually used to treat GAD:   Medication. Antidepressant medication usually is prescribed for long-term daily control. Antianxiety medicines may be added in severe cases, especially when panic attacks occur.   Talk therapy (psychotherapy). Certain types of talk therapy  can be helpful in treating GAD by providing support, education, and guidance. A form of talk therapy called cognitive behavioral therapy can teach you healthy ways to think about and react to daily life events and activities.  Stress managementtechniques. These  include yoga, meditation, and exercise and can be very helpful when they are practiced regularly. A mental health specialist can help determine which treatment is best for you. Some people see improvement with one therapy. However, other people require a combination of therapies. This information is not intended to replace advice given to you by your health care provider. Make sure you discuss any questions you have with your health care provider. Document Released: 01/02/2013 Document Revised: 09/28/2014 Document Reviewed: 01/02/2013 Elsevier Interactive Patient Education  2017 [employer].

## 2016-11-19 ENCOUNTER — Encounter: Payer: Self-pay | Admitting: Family Medicine

## 2016-11-19 ENCOUNTER — Other Ambulatory Visit: Payer: Self-pay | Admitting: Family Medicine

## 2016-11-19 DIAGNOSIS — F411 Generalized anxiety disorder: Secondary | ICD-10-CM

## 2016-11-19 MED ORDER — FLUOXETINE HCL 40 MG PO CAPS: 40.0000 mg | ORAL_CAPSULE | Freq: Every day | ORAL | 3 refills | Status: DC

## 2016-11-19 NOTE — Telephone Encounter (Signed)
Requesting:  Clonazepam  Contract  Signed on 05/08/2015    UDS  Moderate risk--last one done on 9/5/107- now overdue Last OV   10/27/2016 Last Refill   #60 with 0 refills on 08/05/2016  Please Advise

## 2016-11-19 NOTE — Telephone Encounter (Signed)
Faxed hardcopy for [facility]epam to [facility] in [place] 

## 2016-11-20 ENCOUNTER — Other Ambulatory Visit: Payer: Self-pay | Admitting: Family Medicine

## 2016-11-20 DIAGNOSIS — F411 Generalized anxiety disorder: Secondary | ICD-10-CM

## 2016-11-27 ENCOUNTER — Encounter: Payer: Self-pay | Admitting: Family Medicine

## 2016-11-27 NOTE — Telephone Encounter (Signed)
Patient sent email today 11/27/16 requesting this medication to be sent to [facility] [place] that is where she now lives and will continue to get refills from this pharmacy. [facility] [place] to cancel refill that was faxed--which they did cancel. Then called [facility] [place] and gave a verbal refill of clonazepam  #60  With 0 Refills. Contacted the patient through mychart that refill should be ready later today 11/27/16 at the [facility] [place] .

## 2016-12-02 ENCOUNTER — Other Ambulatory Visit: Payer: Self-pay | Admitting: Family Medicine

## 2016-12-02 DIAGNOSIS — M5441 Lumbago with sciatica, right side: Secondary | ICD-10-CM

## 2016-12-02 NOTE — Telephone Encounter (Signed)
Requesting:   Hydrocodone Contract    09/25/2016 UDS   09/25/2016 Last OV   10/27/2016----no upcoming appt. Scheduled at this time. Last Refill   #45 with 0 refills on 10/27/2016  Please Advise

## 2016-12-03 MED ORDER — HYDROCODONE-ACETAMINOPHEN 5-325 MG PO TABS: 1.0000 | ORAL_TABLET | Freq: Four times a day (QID) | ORAL | 0 refills | Status: DC | PRN

## 2016-12-03 NOTE — Telephone Encounter (Signed)
Indication for chronic opioid: chronic pain Medication and dose: norco 5/325 # pills per month: 45 Last UDS date: 09/25/16 Pain contract signed (Y/N): 09/25/16 Date narcotic database last reviewed (include red flags): 12/03/2016  Refill x1

## 2016-12-03 NOTE — Telephone Encounter (Signed)
Called the patient informed to pickup hardcopy of hydrocodone at the front desk. Had to leave a message as did not answer so left a message.

## 2016-12-17 ENCOUNTER — Other Ambulatory Visit: Payer: Self-pay | Admitting: Family Medicine

## 2016-12-17 DIAGNOSIS — R1013 Epigastric pain: Secondary | ICD-10-CM

## 2016-12-31 ENCOUNTER — Other Ambulatory Visit: Payer: Self-pay | Admitting: Family Medicine

## 2016-12-31 DIAGNOSIS — M5441 Lumbago with sciatica, right side: Secondary | ICD-10-CM

## 2016-12-31 MED ORDER — HYDROCODONE-ACETAMINOPHEN 5-325 MG PO TABS: 1.0000 | ORAL_TABLET | Freq: Four times a day (QID) | ORAL | 0 refills | Status: DC | PRN

## 2017-01-01 ENCOUNTER — Ambulatory Visit (INDEPENDENT_AMBULATORY_CARE_PROVIDER_SITE_OTHER): Payer: Managed Care, Other (non HMO) | Admitting: Family Medicine

## 2017-01-01 VITALS — BP 101/60 | HR 83 | Temp 98.6°F | Ht 62.0 in | Wt 196.0 lb

## 2017-01-01 DIAGNOSIS — H8113 Benign paroxysmal vertigo, bilateral: Secondary | ICD-10-CM | POA: Diagnosis not present

## 2017-01-01 DIAGNOSIS — R002 Palpitations: Secondary | ICD-10-CM | POA: Diagnosis not present

## 2017-01-01 DIAGNOSIS — R5383 Other fatigue: Secondary | ICD-10-CM

## 2017-01-01 LAB — COMPREHENSIVE METABOLIC PANEL
ALBUMIN: 4.5 g/dL (ref 3.5–5.2)
ALT: 26 U/L (ref 0–35)
AST: 23 U/L (ref 0–37)
Alkaline Phosphatase: 62 U/L (ref 39–117)
BUN: 19 mg/dL (ref 6–23)
CHLORIDE: 103 meq/L (ref 96–112)
CO2: 25 mEq/L (ref 19–32)
Calcium: 9.5 mg/dL (ref 8.4–10.5)
Creatinine, Ser: 0.52 mg/dL (ref 0.40–1.20)
GFR: 134.02 mL/min (ref >60.00)
Glucose, Bld: 92 mg/dL (ref 70–99)
POTASSIUM: 3.9 meq/L (ref 3.5–5.1)
SODIUM: 135 meq/L (ref 135–145)
Total Bilirubin: 0.4 mg/dL (ref 0.2–1.2)
Total Protein: 7.5 g/dL (ref 6.0–8.3)

## 2017-01-01 LAB — CBC
HEMATOCRIT: 37.7 % (ref 36.0–46.0)
HEMOGLOBIN: 12.8 g/dL (ref 12.0–15.0)
MCHC: 33.9 g/dL (ref 30.0–36.0)
MCV: 86.4 fl (ref 78.0–100.0)
Platelets: 328 10*3/uL (ref 150.0–400.0)
RBC: 4.36 Mil/uL (ref 3.87–5.11)
RDW: 14.4 % (ref 11.5–15.5)
WBC: 6.3 10*3/uL (ref 4.0–10.5)

## 2017-01-01 LAB — MAGNESIUM: Magnesium: 2 mg/dL (ref 1.5–2.5)

## 2017-01-01 LAB — TSH: TSH: 1.3 u[IU]/mL (ref 0.35–4.50)

## 2017-01-01 MED ORDER — MECLIZINE HCL 25 MG PO TABS: 25.0000 mg | ORAL_TABLET | Freq: Three times a day (TID) | ORAL | 0 refills | Status: DC | PRN

## 2017-01-01 NOTE — Patient Instructions (Addendum)
Call on Monday if you are not improved and we will send you to someone who will perform the maneuver. Youtube has good videos on how to perform this.  Give [place] 2-3 business days to get the results of your labs back.   How to Perform the Epley Maneuver The Epley maneuver is an exercise that relieves symptoms of vertigo. Vertigo is the feeling that you or your surroundings are moving when they are not. When you feel vertigo, you may feel like the room is spinning and have trouble walking. Dizziness is a little different than vertigo. When you are dizzy, you may feel unsteady or light-headed. You can do this maneuver at home whenever you have symptoms of vertigo. You can do it up to 3 times a day until your symptoms go away. Even though the Epley maneuver may relieve your vertigo for a few weeks, it is possible that your symptoms will return. This maneuver relieves vertigo, but it does not relieve dizziness. What are the risks? If it is done correctly, the Epley maneuver is considered safe. Sometimes it can lead to dizziness or nausea that goes away after a short time. If you develop other symptoms, such as changes in vision, weakness, or numbness, stop doing the maneuver and call your health care provider. How to perform the Epley maneuver 1. Sit on the edge of a bed or table with your back straight and your legs extended or hanging over the edge of the bed or table. 2. Turn your head halfway toward the affected ear or side. 3. Lie backward quickly with your head turned until you are lying flat on your back. You may want to position a pillow under your shoulders. 4. Hold this position for 30 seconds. You may experience an attack of vertigo. This is normal. 5. Turn your head to the opposite direction until your unaffected ear is facing the floor. 6. Hold this position for 30 seconds. You may experience an attack of vertigo. This is normal. Hold this position until the vertigo stops. 7. Turn your whole  body to the same side as your head. Hold for another 30 seconds. 8. Sit back up. You can repeat this exercise up to 3 times a day. Follow these instructions at home:  After doing the Epley maneuver, you can return to your normal activities.  Ask your health care provider if there is anything you should do at home to prevent vertigo. He or she may recommend that you:  Keep your head raised (elevated) with two or more pillows while you sleep.  Do not sleep on the side of your affected ear.  Get up slowly from bed.  Avoid sudden movements during the day.  Avoid extreme head movement, like looking up or bending over. Contact a health care provider if:  Your vertigo gets worse.  You have other symptoms, including:  Nausea.  Vomiting.  Headache. Get help right away if:  You have vision changes.  You have a severe or worsening headache or neck pain.  You cannot stop vomiting.  You have new numbness or weakness in any part of your body. Summary  Vertigo is the feeling that you or your surroundings are moving when they are not.  The Epley maneuver is an exercise that relieves symptoms of vertigo.  If the Epley maneuver is done correctly, it is considered safe. You can do it up to 3 times a day. This information is not intended to replace advice given to you by  your health care provider. Make sure you discuss any questions you have with your health care provider. Document Released: 09/12/2013 Document Revised: 07/28/2016 Document Reviewed: 07/28/2016 Elsevier Interactive Patient Education  2017 [employer].

## 2017-01-01 NOTE — Progress Notes (Signed)
Chief Complaint  Patient presents with  . Dizziness    New onset    Veronica Yates is 46 y.o. pt here for dizziness.  Duration: 7 days Pass out? No Spinning? Yes  Lasts 5-6 s. Turning over in bed makes it worse. Recent illness/fever? No Headache? No Neurologic signs? No Change in PO intake or meds? No  Associated fatigue and palpitations when she gets vertigo while standing. She does wear CPAP.  Some SOB during palpitations. No chest pain or swelling.  ROS:  Neuro: As noted in HPI Eyes: No vision changes  Past Medical History:  Diagnosis Date  . Depression   . GERD (gastroesophageal reflux disease)     Family History  Problem Relation Age of Onset  . Depression    . Bipolar disorder Sister   . Diabetes Mother   . Diabetes Father     Allergies as of 01/01/2017      Reactions   Penicillins    Unknown--reaction as an infant      Medication List       Accurate as of 01/01/17 10:42 AM. Always use your most recent med list.          amphetamine-dextroamphetamine 30 MG 24 hr capsule Commonly known as:  ADDERALL XR Take 2 capsules (60 mg total) by mouth every morning.   clindamycin 1 % lotion Commonly known as:  CLEOCIN T APPLY TOPICALLY 2 TIMES DAILY.   clonazePAM 1 MG tablet Commonly known as:  KLONOPIN TAKE ONE TABLET BY MOUTH 2 TIMES DAILY AS NEEDED   eszopiclone 2 MG Tabs tablet Commonly known as:  LUNESTA Take 1 tablet (2 mg total) by mouth at bedtime as needed for sleep. Take immediately before bedtime   FLUoxetine 40 MG capsule Commonly known as:  PROZAC Take 1 capsule (40 mg total) by mouth daily.   HYDROcodone-acetaminophen 5-325 MG tablet Commonly known as:  NORCO/VICODIN Take 1 tablet by mouth every 6 (six) hours as needed for moderate pain.   levocetirizine 5 MG tablet Commonly known as:  XYZAL Take 1 tablet (5 mg total) by mouth every evening.   meclizine 25 MG tablet Commonly known as:  ANTIVERT Take 1 tablet (25 mg total) by  mouth 3 (three) times daily as needed for dizziness.   meloxicam 15 MG tablet Commonly known as:  MOBIC TAKE 1 TABLET BY MOUTH EVERY DAY   pantoprazole 40 MG tablet Commonly known as:  PROTONIX TAKE 1 TABLET (40 MG TOTAL) BY MOUTH DAILY.       BP 101/60   Pulse 83   Temp 98.6 F (37 C) (Oral)   Ht  (1.575 m)   Wt 196 lb (88.9 kg)   LMP 11/28/2016 (Approximate)   SpO2 99%   BMI 35.85 kg/m  General: Awake, alert, appears stated age Eyes: PERRLA, EOMi Ears: Patent, TM's neg b/l Heart: RRR, no murmurs, no carotid bruits Lungs: CTAB, no accessory muscle use MSK: 5/5 strength throughout, gait normal Neuro: No cerebellar signs, patellar reflex 0/4 b/l wo clonus, calcaneal reflex 0/4 b/l wo clonus, biceps reflex 1/4 b/l wo clonus; Dix-Hall-Pike positive b/l. Psych: Age appropriate judgment and insight, normal mood and affect  Benign paroxysmal positional vertigo due to bilateral vestibular disorder - Plan: meclizine (ANTIVERT) 25 MG tablet  Fatigue, unspecified type - Plan: Comprehensive metabolic panel, CBC  Palpitations - Plan: TSH, Comprehensive metabolic panel, Magnesium  Orders as above. [facility] Maneuver instructions provided, referred to [facility] for further questions. If no better by  Mon, will call for referral to vestibular rehab.  Given fatigue and reports of palpitations, will obtain labs. If does not resolve with dizziness, should return. F/u prn otherwise. Pt voiced understanding and agreement to the plan.  Jilda Roche Alma, DO 01/01/17 10:42 AM

## 2017-01-02 ENCOUNTER — Other Ambulatory Visit: Payer: Self-pay | Admitting: Family Medicine

## 2017-01-02 DIAGNOSIS — F411 Generalized anxiety disorder: Secondary | ICD-10-CM

## 2017-01-04 NOTE — Telephone Encounter (Signed)
Faxed [facility] [place]

## 2017-01-04 NOTE — Telephone Encounter (Signed)
Requesting:   clonazepam Contract    04/1715 UDS  Moderate last on 08/25/16 Last OV   10/27/16 Last Refill    #60 no refills on 11/19/2016  Please Advise

## 2017-01-07 ENCOUNTER — Other Ambulatory Visit: Payer: Self-pay | Admitting: Family Medicine

## 2017-01-08 ENCOUNTER — Other Ambulatory Visit: Payer: Self-pay | Admitting: Family Medicine

## 2017-01-08 DIAGNOSIS — F988 Other specified behavioral and emotional disorders with onset usually occurring in childhood and adolescence: Secondary | ICD-10-CM

## 2017-01-11 ENCOUNTER — Encounter: Payer: Self-pay | Admitting: Family Medicine

## 2017-01-11 ENCOUNTER — Other Ambulatory Visit: Payer: Self-pay | Admitting: Family Medicine

## 2017-01-11 DIAGNOSIS — F988 Other specified behavioral and emotional disorders with onset usually occurring in childhood and adolescence: Secondary | ICD-10-CM

## 2017-01-11 MED ORDER — AMPHETAMINE-DEXTROAMPHET ER 30 MG PO CP24: 60.0000 mg | ORAL_CAPSULE | ORAL | 0 refills | Status: DC

## 2017-01-14 ENCOUNTER — Encounter: Payer: Self-pay | Admitting: Family Medicine

## 2017-02-09 ENCOUNTER — Encounter: Payer: Self-pay | Admitting: Family Medicine

## 2017-02-09 ENCOUNTER — Ambulatory Visit (INDEPENDENT_AMBULATORY_CARE_PROVIDER_SITE_OTHER): Payer: Managed Care, Other (non HMO) | Admitting: Family Medicine

## 2017-02-09 VITALS — BP 128/66 | HR 85 | Temp 98.6°F | Resp 16 | Ht 62.0 in | Wt 199.0 lb

## 2017-02-09 DIAGNOSIS — F320 Major depressive disorder, single episode, mild: Secondary | ICD-10-CM

## 2017-02-09 DIAGNOSIS — M5441 Lumbago with sciatica, right side: Secondary | ICD-10-CM | POA: Diagnosis not present

## 2017-02-09 MED ORDER — HYDROCODONE-ACETAMINOPHEN 5-325 MG PO TABS: 1.0000 | ORAL_TABLET | Freq: Four times a day (QID) | ORAL | 0 refills | Status: DC | PRN

## 2017-02-09 MED ORDER — LEVOMILNACIPRAN HCL ER 40 MG PO CP24: 1.0000 | ORAL_CAPSULE | Freq: Every day | ORAL | 0 refills | Status: DC

## 2017-02-09 MED ORDER — LEVOMILNACIPRAN HCL ER 20 MG PO CP24: 1.0000 | ORAL_CAPSULE | Freq: Every day | ORAL | 0 refills | Status: DC

## 2017-02-09 MED ORDER — LEVOMILNACIPRAN HCL ER 20 & 40 MG PO C4PK: EXTENDED_RELEASE_CAPSULE | ORAL | 0 refills | Status: DC

## 2017-02-09 NOTE — Patient Instructions (Signed)

## 2017-02-09 NOTE — Progress Notes (Signed)
Patient ID: Veronica Yates, female   DOB: [birth year], 46 y.o.   MRN: [id number]     Subjective:  I acted as a Neurosurgeonscribe for Dr. Zola ButtonLowne-Chase.  Apolonio SchneidersSheketia, CMA   Patient ID: Veronica Yates, female    DOB: [birth year], 46 y.o.   MRN: [id number]  Chief Complaint  Patient presents with  . Medication Problem    feels like prozac is not working.      HPI  Patient is in today because she feels that her Prozac is not as effective.  Its been like this for about a month.  She states at first she felt like it did work but does not.  She has been on it since 09/25/16.  Patient Care Team: Zola ButtonLowne Chase, Grayling CongressYvonne R, DO as PCP - General Carrington ClampHorvath, Michelle, MD as Consulting Physician (Obstetrics and Gynecology)   Past Medical History:  Diagnosis Date  . Depression   . GERD (gastroesophageal reflux disease)     Past Surgical History:  Procedure Laterality Date  . CESAREAN SECTION    . dnc    . laparascopy    . TONSILLECTOMY    . tubal reversal      Family History  Problem Relation Age of Onset  . Depression Unknown   . Bipolar disorder Sister   . Diabetes Mother   . Diabetes Father     Social History   Social History  . Marital status: Married    Spouse name: N/A  . Number of children: N/A  . Years of education: N/A   Occupational History  . [employer]    Social History Main Topics  . Smoking status: Never Smoker  . Smokeless tobacco: Never Used  . Alcohol use No  . Drug use: No  . Sexual activity: Not on file   Other Topics Concern  . Not on file   Social History Narrative  . No narrative on file    Outpatient Medications Prior to Visit  Medication Sig Dispense Refill  . amphetamine-dextroamphetamine (ADDERALL XR) 30 MG 24 hr capsule Take 2 capsules (60 mg total) by mouth every morning. 60 capsule 0  . clindamycin (CLEOCIN T) 1 % lotion APPLY TOPICALLY 2 TIMES DAILY. 60 mL 1  . clonazePAM (KLONOPIN) 1 MG tablet TAKE 1 TABLET BY MOUTH TWICE DAILY AS NEEDED 60  tablet 0  . meloxicam (MOBIC) 15 MG tablet TAKE 1 TABLET BY MOUTH EVERY DAY 30 tablet 0  . pantoprazole (PROTONIX) 40 MG tablet TAKE 1 TABLET (40 MG TOTAL) BY MOUTH DAILY. 30 tablet 1  . FLUoxetine (PROZAC) 40 MG capsule Take 1 capsule (40 mg total) by mouth daily. 90 capsule 3  . HYDROcodone-acetaminophen (NORCO/VICODIN) 5-325 MG tablet Take 1 tablet by mouth every 6 (six) hours as needed for moderate pain. 45 tablet 0  . eszopiclone (LUNESTA) 2 MG TABS tablet Take 1 tablet (2 mg total) by mouth at bedtime as needed for sleep. Take immediately before bedtime 30 tablet 0  . levocetirizine (XYZAL) 5 MG tablet Take 1 tablet (5 mg total) by mouth every evening. 90 tablet 3  . meclizine (ANTIVERT) 25 MG tablet Take 1 tablet (25 mg total) by mouth 3 (three) times daily as needed for dizziness. 30 tablet 0   No facility-administered medications prior to visit.     Allergies  Allergen Reactions  . Penicillins     Unknown--reaction as an infant    Review of Systems  Constitutional: Negative for fever and malaise/fatigue.  HENT: Negative for  congestion.   Eyes: Negative for blurred vision.  Respiratory: Negative for cough and shortness of breath.   Cardiovascular: Negative for chest pain, palpitations and leg swelling.  Gastrointestinal: Negative for vomiting.  Musculoskeletal: Negative for back pain.  Skin: Negative for rash.  Neurological: Negative for loss of consciousness and headaches.   Depression screen [facility] 2/9 02/09/2017 10/27/2016 09/25/2016 04/20/2016  Decreased Interest 1 0 3 0  Down, Depressed, Hopeless 2 0 2 0  PHQ - 2 Score 3 0 5 0  Altered sleeping 0 - 1 -  Tired, decreased energy 2 - 3 -  Change in appetite 0 - 3 -  Feeling bad or failure about yourself  1 - 2 -  Trouble concentrating 1 - 1 -  Moving slowly or fidgety/restless 0 - 1 -  Suicidal thoughts 0 - 0 -  PHQ-9 Score 7 - 16 -  Difficult doing work/chores Very difficult - Somewhat difficult -       Objective:      Physical Exam  Constitutional: She is oriented to person, place, and time. She appears well-developed and well-nourished. No distress.  HENT:  Head: Normocephalic and atraumatic.  Eyes: Conjunctivae are normal.  Neck: Normal range of motion. No thyromegaly present.  Cardiovascular: Normal rate and regular rhythm.   Pulmonary/Chest: Effort normal and breath sounds normal. She has no wheezes.  Abdominal: Soft. Bowel sounds are normal. There is no tenderness.  Musculoskeletal: Normal range of motion. She exhibits no edema or deformity.  Neurological: She is alert and oriented to person, place, and time.  Skin: Skin is warm and dry. She is not diaphoretic.  Psychiatric: She has a normal mood and affect.    BP 128/66 (BP Location: Left Arm, Cuff Size: Normal)   Pulse 85   Temp 98.6 F (37 C) (Oral)   Resp 16   Ht 5\' 2"  (1.575 m)   Wt 199 lb (90.3 kg)   LMP 01/31/2017   SpO2 99%   BMI 36.40 kg/m  Wt Readings from Last 3 Encounters:  02/09/17 199 lb (90.3 kg)  01/01/17 196 lb (88.9 kg)  10/27/16 215 lb 9.6 oz (97.8 kg)   BP Readings from Last 3 Encounters:  02/09/17 128/66  01/01/17 101/60  10/27/16 120/80     Immunization History  Administered Date(s) Administered  . Influenza,inj,Quad PF,36+ Mos 09/11/2014, 10/11/2015, 10/27/2016  . Td 06/29/2006  . Tdap 10/27/2016    Health Maintenance  Topic Date Due  . PAP SMEAR  06/13/1990  . HIV Screening  04/20/2017 (Originally 06/13/1984)  . MAMMOGRAM  03/26/2017  . INFLUENZA VACCINE  04/21/2017  . TETANUS/TDAP  10/27/2026    Lab Results  Component Value Date   WBC 6.3 01/01/2017   HGB 12.8 01/01/2017   HCT 37.7 01/01/2017   PLT 328.0 01/01/2017   GLUCOSE 92 01/01/2017   CHOL 247 (H) 09/25/2016   TRIG 95 09/25/2016   HDL 67 09/25/2016   LDLCALC 161 (H) 09/25/2016   ALT 26 01/01/2017   AST 23 01/01/2017   NA 135 01/01/2017   K 3.9 01/01/2017   CL 103 01/01/2017   CREATININE 0.52 01/01/2017   BUN 19 01/01/2017    CO2 25 01/01/2017   TSH 1.30 01/01/2017    Lab Results  Component Value Date   TSH 1.30 01/01/2017   Lab Results  Component Value Date   WBC 6.3 01/01/2017   HGB 12.8 01/01/2017   HCT 37.7 01/01/2017   MCV 86.4 01/01/2017   PLT  328.0 01/01/2017   Lab Results  Component Value Date   NA 135 01/01/2017   K 3.9 01/01/2017   CO2 25 01/01/2017   GLUCOSE 92 01/01/2017   BUN 19 01/01/2017   CREATININE 0.52 01/01/2017   BILITOT 0.4 01/01/2017   ALKPHOS 62 01/01/2017   AST 23 01/01/2017   ALT 26 01/01/2017   PROT 7.5 01/01/2017   ALBUMIN 4.5 01/01/2017   CALCIUM 9.5 01/01/2017   GFR 134.02 01/01/2017   Lab Results  Component Value Date   CHOL 247 (H) 09/25/2016   Lab Results  Component Value Date   HDL 67 09/25/2016   Lab Results  Component Value Date   LDLCALC 161 (H) 09/25/2016   Lab Results  Component Value Date   TRIG 95 09/25/2016   Lab Results  Component Value Date   CHOLHDL 3.7 09/25/2016   No results found for: HGBA1C       Assessment & Plan:   Problem List Items Addressed This Visit    None    Visit Diagnoses    Depression, major, single episode, mild (HCC)    -  Primary   Relevant Medications   Levomilnacipran HCl ER (FETZIMA) 20 MG CP24   Levomilnacipran HCl ER (FETZIMA) 40 MG CP24   Right-sided low back pain with right-sided sciatica, unspecified chronicity       Relevant Medications   HYDROcodone-acetaminophen (NORCO/VICODIN) 5-325 MG tablet   Levomilnacipran HCl ER (FETZIMA) 20 MG CP24   Levomilnacipran HCl ER (FETZIMA) 40 MG CP24    f/u 4-6 weeks for f/u or sooner prn  I have discontinued Ms. Lococo's eszopiclone, levocetirizine, FLUoxetine, meclizine, and Levomilnacipran HCl ER. I am also having her start on Levomilnacipran HCl ER and Levomilnacipran HCl ER. Additionally, I am having her maintain her clindamycin, meloxicam, pantoprazole, clonazePAM, amphetamine-dextroamphetamine, and HYDROcodone-acetaminophen.  Meds ordered this  encounter  Medications  . DISCONTD: Levomilnacipran HCl ER (FETZIMA TITRATION) 20 & 40 MG C4PK    Sig: As directed    Dispense:  1 each    Refill:  0  . HYDROcodone-acetaminophen (NORCO/VICODIN) 5-325 MG tablet    Sig: Take 1 tablet by mouth every 6 (six) hours as needed for moderate pain.    Dispense:  45 tablet    Refill:  0  . Levomilnacipran HCl ER (FETZIMA) 20 MG CP24    Sig: Take 1 capsule by mouth daily.    Dispense:  30 capsule    Refill:  0  . Levomilnacipran HCl ER (FETZIMA) 40 MG CP24    Sig: Take 1 capsule by mouth daily.    Dispense:  30 capsule    Refill:  0    CMA served as scribe during this visit. History, Physical and Plan performed by medical provider. Documentation and orders reviewed and attested to.  Donato Schultz, DO

## 2017-02-10 ENCOUNTER — Encounter: Payer: Self-pay | Admitting: Family Medicine

## 2017-02-10 DIAGNOSIS — F988 Other specified behavioral and emotional disorders with onset usually occurring in childhood and adolescence: Secondary | ICD-10-CM

## 2017-02-12 ENCOUNTER — Other Ambulatory Visit: Payer: Self-pay | Admitting: *Deleted

## 2017-02-12 DIAGNOSIS — F339 Major depressive disorder, recurrent, unspecified: Secondary | ICD-10-CM

## 2017-02-12 MED ORDER — VORTIOXETINE HBR 10 MG PO TABS: 1.0000 | ORAL_TABLET | Freq: Every day | ORAL | 0 refills | Status: DC

## 2017-02-12 MED ORDER — AMPHETAMINE-DEXTROAMPHET ER 30 MG PO CP24: 60.0000 mg | ORAL_CAPSULE | ORAL | 0 refills | Status: DC

## 2017-02-12 NOTE — Telephone Encounter (Signed)
Ins does not cover Fetzima.  New rx for Trintellix sent in.

## 2017-02-12 NOTE — Addendum Note (Signed)
Addended by: Scharlene GlossEWING, ROBIN B on: 02/12/2017 03:11 PM   Modules accepted: Orders

## 2017-03-02 ENCOUNTER — Other Ambulatory Visit: Payer: Self-pay | Admitting: Family Medicine

## 2017-03-02 ENCOUNTER — Encounter: Payer: Self-pay | Admitting: Family Medicine

## 2017-03-02 DIAGNOSIS — F411 Generalized anxiety disorder: Secondary | ICD-10-CM

## 2017-03-02 NOTE — Telephone Encounter (Signed)
Requesting:   clonazepam Contract     05/08/2015 UDS   Moderate---09/25/2016 Last OV   02/09/2017 Last Refill   #60 no refills on 01/04/2017  Please Advise

## 2017-03-02 NOTE — Telephone Encounter (Signed)
Need ov

## 2017-03-02 NOTE — Telephone Encounter (Signed)
[facility]axed [facility] [place]

## 2017-03-08 ENCOUNTER — Telehealth: Payer: Self-pay | Admitting: Family Medicine

## 2017-03-08 ENCOUNTER — Ambulatory Visit: Payer: Self-pay | Admitting: Family Medicine

## 2017-03-08 DIAGNOSIS — Z0289 Encounter for other administrative examinations: Secondary | ICD-10-CM

## 2017-03-08 NOTE — Telephone Encounter (Signed)
Patient did leave a VM 03/07/17 at 7:24am stating no transportation

## 2017-03-08 NOTE — Telephone Encounter (Signed)
Patient no showed 11:30am physical appointment today as per MD skpe charge patient for no show

## 2017-03-09 NOTE — Telephone Encounter (Signed)
No charge. 

## 2017-03-13 ENCOUNTER — Other Ambulatory Visit: Payer: Self-pay | Admitting: Family Medicine

## 2017-03-13 DIAGNOSIS — F339 Major depressive disorder, recurrent, unspecified: Secondary | ICD-10-CM

## 2017-03-18 ENCOUNTER — Encounter: Payer: Self-pay | Admitting: Family Medicine

## 2017-03-29 NOTE — Telephone Encounter (Signed)
database 

## 2017-03-31 NOTE — Telephone Encounter (Signed)
Database ran and is on your desk  

## 2017-03-31 NOTE — Telephone Encounter (Signed)
Check database-- I thought she got some from somewhere else-- but I could be wrong She never had xray done --- order is in and she needs eight ov here sooner or with ortho/ neurosurgeon-- I think she saw Dr Noel Geroldohen in past

## 2017-04-01 ENCOUNTER — Other Ambulatory Visit: Payer: Self-pay | Admitting: Family Medicine

## 2017-04-01 DIAGNOSIS — M5441 Lumbago with sciatica, right side: Secondary | ICD-10-CM

## 2017-04-02 MED ORDER — HYDROCODONE-ACETAMINOPHEN 5-325 MG PO TABS: 1.0000 | ORAL_TABLET | Freq: Four times a day (QID) | ORAL | 0 refills | Status: DC | PRN

## 2017-04-06 ENCOUNTER — Ambulatory Visit (HOSPITAL_BASED_OUTPATIENT_CLINIC_OR_DEPARTMENT_OTHER)
Admission: RE | Admit: 2017-04-06 | Discharge: 2017-04-06 | Disposition: A | Discharge status: Home / Self Care | Payer: Managed Care, Other (non HMO) | Source: Ambulatory Visit | Attending: Family Medicine | Admitting: Family Medicine

## 2017-04-06 ENCOUNTER — Ambulatory Visit (INDEPENDENT_AMBULATORY_CARE_PROVIDER_SITE_OTHER): Payer: Managed Care, Other (non HMO) | Admitting: Family Medicine

## 2017-04-06 ENCOUNTER — Encounter: Payer: Self-pay | Admitting: Family Medicine

## 2017-04-06 VITALS — BP 120/68 | HR 91 | Temp 99.0°F | Resp 16 | Ht 62.0 in | Wt 205.5 lb

## 2017-04-06 DIAGNOSIS — M545 Low back pain, unspecified: Secondary | ICD-10-CM

## 2017-04-06 DIAGNOSIS — M5440 Lumbago with sciatica, unspecified side: Secondary | ICD-10-CM

## 2017-04-06 DIAGNOSIS — Q7649 Other congenital malformations of spine, not associated with scoliosis: Secondary | ICD-10-CM | POA: Diagnosis not present

## 2017-04-06 NOTE — Patient Instructions (Signed)

## 2017-04-06 NOTE — Progress Notes (Signed)
Patient ID: Veronica CrockerMelissa Yates, female   DOB: 1970-12-08, 46 y.o.   MRN: [id number]     Subjective:  I acted as a Neurosurgeonscribe for Dr. Zola Yates.  Veronica Yates, CMA   Patient ID: Veronica CrockerMelissa Yates, female    DOB: 1970-12-08, 46 y.o.   MRN: [id number]  Chief Complaint  Patient presents with  . Back Pain    Back Pain  This is a chronic problem. Episode onset: 2-2 and 1/2 months. The pain is present in the lumbar spine (right hip). The symptoms are aggravated by lying down. Associated symptoms include leg pain, numbness, tingling and weakness. Pertinent negatives include no abdominal pain, bladder incontinence, bowel incontinence, chest pain or dysuria. (Numbness right leg )    Patient is in today for back pain.  Patient Care Team: Veronica Yates, Veronica CongressYvonne R, DO as PCP - General Carrington ClampHorvath, Michelle, MD as Consulting Physician (Obstetrics and Gynecology)   Past Medical History:  Diagnosis Date  . Depression   . GERD (gastroesophageal reflux disease)     Past Surgical History:  Procedure Laterality Date  . CESAREAN SECTION    . dnc    . laparascopy    . TONSILLECTOMY    . tubal reversal      Family History  Problem Relation Age of Onset  . Depression Unknown   . Bipolar disorder Sister   . Diabetes Mother   . Diabetes Father     Social History   Social History  . Marital status: Married    Spouse name: N/A  . Number of children: N/A  . Years of education: N/A   Occupational History  . [employer]    Social History Main Topics  . Smoking status: Never Smoker  . Smokeless tobacco: Never Used  . Alcohol use No  . Drug use: No  . Sexual activity: Not on file   Other Topics Concern  . Not on file   Social History Narrative  . No narrative on file    Outpatient Medications Prior to Visit  Medication Sig Dispense Refill  . amphetamine-dextroamphetamine (ADDERALL XR) 30 MG 24 hr capsule Take 2 capsules (60 mg total) by mouth every morning. 60 capsule 0  .  clindamycin (CLEOCIN T) 1 % lotion APPLY TOPICALLY 2 TIMES DAILY. 60 mL 1  . clonazePAM (KLONOPIN) 1 MG tablet TAKE 1 TABLET BY MOUTH 2 TIMES DAILY AS NEEDED 60 tablet 0  . HYDROcodone-acetaminophen (NORCO/VICODIN) 5-325 MG tablet Take 1 tablet by mouth every 6 (six) hours as needed for moderate pain. 45 tablet 0  . Levomilnacipran HCl ER (FETZIMA) 20 MG CP24 Take 1 capsule by mouth daily. 30 capsule 0  . Levomilnacipran HCl ER (FETZIMA) 40 MG CP24 Take 1 capsule by mouth daily. 30 capsule 0  . meloxicam (MOBIC) 15 MG tablet TAKE 1 TABLET BY MOUTH EVERY DAY 30 tablet 0  . pantoprazole (PROTONIX) 40 MG tablet TAKE 1 TABLET (40 MG TOTAL) BY MOUTH DAILY. 30 tablet 1  . TRINTELLIX 10 MG TABS TAKE 1 TABLET BY MOUTH EVERY DAY 30 tablet 0   No facility-administered medications prior to visit.     Allergies  Allergen Reactions  . Penicillins     Unknown--reaction as an infant    Review of Systems  Cardiovascular: Negative for chest pain.  Gastrointestinal: Negative for abdominal pain and bowel incontinence.  Genitourinary: Negative for bladder incontinence and dysuria.  Musculoskeletal: Positive for back pain.  Neurological: Positive for tingling, weakness and numbness.  Objective:    Physical Exam  Constitutional: She is oriented to person, place, and time. She appears well-developed and well-nourished.  HENT:  Head: Normocephalic and atraumatic.  Eyes: Conjunctivae and EOM are normal.  Neck: Normal range of motion. Neck supple. No JVD present. Carotid bruit is not present. No thyromegaly present.  Cardiovascular: Normal rate, regular rhythm and normal heart sounds.   No murmur heard. Pulmonary/Chest: Effort normal and breath sounds normal. No respiratory distress. She has no wheezes. She has no rales. She exhibits no tenderness.  Musculoskeletal: She exhibits no edema.  Neurological: She is alert and oriented to person, place, and time. She displays normal reflexes.  Some  slight weakness Yates foot with dorsiflexion Otherwise strength was good in other ext   Psychiatric: She has a normal mood and affect.  Nursing note and vitals reviewed.   BP 120/68 (BP Location: Left Arm, Cuff Size: Normal)   Pulse 91   Temp 99 F (37.2 C) (Oral)   Resp 16   Ht 5\' 2"  (1.575 m)   Wt 205 lb 8 oz (93.2 kg)   LMP 03/10/2017   SpO2 98%   BMI 37.59 kg/m  Wt Readings from Last 3 Encounters:  04/06/17 205 lb 8 oz (93.2 kg)  02/09/17 199 lb (90.3 kg)  01/01/17 196 lb (88.9 kg)   BP Readings from Last 3 Encounters:  04/06/17 120/68  02/09/17 128/66  01/01/17 101/60     Immunization History  Administered Date(s) Administered  . Influenza,inj,Quad PF,36+ Mos 09/11/2014, 10/11/2015, 10/27/2016  . Td 06/29/2006  . Tdap 10/27/2016    Health Maintenance  Topic Date Due  . PAP SMEAR  06/13/1990  . MAMMOGRAM  03/26/2017  . HIV Screening  04/20/2017 (Originally 06/13/1984)  . INFLUENZA VACCINE  04/21/2017  . TETANUS/TDAP  10/27/2026    Lab Results  Component Value Date   WBC 6.3 01/01/2017   HGB 12.8 01/01/2017   HCT 37.7 01/01/2017   PLT 328.0 01/01/2017   GLUCOSE 92 01/01/2017   CHOL 247 (H) 09/25/2016   TRIG 95 09/25/2016   HDL 67 09/25/2016   LDLCALC 161 (H) 09/25/2016   ALT 26 01/01/2017   AST 23 01/01/2017   NA 135 01/01/2017   K 3.9 01/01/2017   CL 103 01/01/2017   CREATININE 0.52 01/01/2017   BUN 19 01/01/2017   CO2 25 01/01/2017   TSH 1.30 01/01/2017    Lab Results  Component Value Date   TSH 1.30 01/01/2017   Lab Results  Component Value Date   WBC 6.3 01/01/2017   HGB 12.8 01/01/2017   HCT 37.7 01/01/2017   MCV 86.4 01/01/2017   PLT 328.0 01/01/2017   Lab Results  Component Value Date   NA 135 01/01/2017   K 3.9 01/01/2017   CO2 25 01/01/2017   GLUCOSE 92 01/01/2017   BUN 19 01/01/2017   CREATININE 0.52 01/01/2017   BILITOT 0.4 01/01/2017   ALKPHOS 62 01/01/2017   AST 23 01/01/2017   ALT 26 01/01/2017   PROT 7.5 01/01/2017     ALBUMIN 4.5 01/01/2017   CALCIUM 9.5 01/01/2017   GFR 134.02 01/01/2017   Lab Results  Component Value Date   CHOL 247 (H) 09/25/2016   Lab Results  Component Value Date   HDL 67 09/25/2016   Lab Results  Component Value Date   LDLCALC 161 (H) 09/25/2016   Lab Results  Component Value Date   TRIG 95 09/25/2016   Lab Results  Component Value Date  CHOLHDL 3.7 09/25/2016   No results found for: HGBA1C       Assessment & Plan:   Problem List Items Addressed This Visit    None    Visit Diagnoses    Low back pain with radiation    -  Primary   Relevant Orders   DG Lumbar Spine Complete (Completed)      con't pain meds Check xray Consider mri / PT  I am having Ms. Bihl maintain her meloxicam, pantoprazole, Levomilnacipran HCl ER, Levomilnacipran HCl ER, amphetamine-dextroamphetamine, clindamycin, clonazePAM, TRINTELLIX, and HYDROcodone-acetaminophen.  No orders of the defined types were placed in this encounter.   CMA served as Neurosurgeon during this visit. History, Physical and Plan performed by medical provider. Documentation and orders reviewed and attested to.  Donato Schultz, DO

## 2017-04-07 ENCOUNTER — Other Ambulatory Visit: Payer: Self-pay | Admitting: Family Medicine

## 2017-04-07 ENCOUNTER — Encounter: Payer: Self-pay | Admitting: Family Medicine

## 2017-04-07 DIAGNOSIS — M5440 Lumbago with sciatica, unspecified side: Secondary | ICD-10-CM

## 2017-04-11 ENCOUNTER — Telehealth: Payer: Self-pay | Admitting: Family Medicine

## 2017-04-11 DIAGNOSIS — F988 Other specified behavioral and emotional disorders with onset usually occurring in childhood and adolescence: Secondary | ICD-10-CM

## 2017-04-12 ENCOUNTER — Encounter: Payer: Self-pay | Admitting: Family Medicine

## 2017-04-12 ENCOUNTER — Other Ambulatory Visit: Payer: Self-pay | Admitting: Family Medicine

## 2017-04-12 DIAGNOSIS — F988 Other specified behavioral and emotional disorders with onset usually occurring in childhood and adolescence: Secondary | ICD-10-CM

## 2017-04-12 NOTE — Telephone Encounter (Signed)
°  Relation to EA:VWUJpt:self Call back number:[phone] (H)   Reason for call:  Patient checking on the status of my chart message amphetamine-dextroamphetamine (ADDERALL XR) 30 MG 24 hr capsule request, patient advised in the future please call at least 48 hours in advance, patient seeking to pick Rx tomorrow she's completely out, please advise

## 2017-04-13 ENCOUNTER — Other Ambulatory Visit: Payer: Self-pay | Admitting: Family Medicine

## 2017-04-13 DIAGNOSIS — F339 Major depressive disorder, recurrent, unspecified: Secondary | ICD-10-CM

## 2017-04-13 MED ORDER — AMPHETAMINE-DEXTROAMPHET ER 30 MG PO CP24: 60.0000 mg | ORAL_CAPSULE | ORAL | 0 refills | Status: DC

## 2017-04-13 NOTE — Telephone Encounter (Signed)
Pt is requesting refill on Adderall.   Last OV: 04/06/2017 Last Fill: 02/12/2017 #60 and 0RF For May and June 2018 UDS: 01/13/2017 Low risk  [place] Controlled Substance Database printed 03/31/2017, in media   Pt called yesterday and this morning checking on status of refill, she is completely out, refill request sent on 04/11/2017.   Please advise.

## 2017-04-13 NOTE — Telephone Encounter (Signed)
Pt informed via MyChart that Rx's have been placed at the front desk for pick up at her convenience.  

## 2017-04-13 NOTE — Telephone Encounter (Signed)
Rx's printed for July, August, and September 2018, awaiting DO signature.

## 2017-04-13 NOTE — Telephone Encounter (Signed)
Refill 3 months

## 2017-04-13 NOTE — Telephone Encounter (Signed)
Have sent refill request from 04/11/2017 to PCP for refill approval-high priority.

## 2017-04-13 NOTE — Telephone Encounter (Signed)
CB: [phone]   Pt called in to check the status of her refill request.  Pt says that she is now completely out. She would like to know if she can receive her Rx as soon as possible.

## 2017-04-16 ENCOUNTER — Other Ambulatory Visit: Payer: Self-pay | Admitting: Family Medicine

## 2017-04-16 DIAGNOSIS — F411 Generalized anxiety disorder: Secondary | ICD-10-CM

## 2017-04-16 NOTE — Telephone Encounter (Signed)
[facility] [place]

## 2017-04-16 NOTE — Telephone Encounter (Signed)
Requesting:   clonazepam Contract   01/13/2017 UDS     Low risk next is due on 07/15/2017 Last OV    7/17.2018 Last Refill   #60  On   03/02/2017 Please Advise

## 2017-04-20 ENCOUNTER — Encounter: Payer: Self-pay | Admitting: Family Medicine

## 2017-04-21 MED ORDER — TRAMADOL HCL 50 MG PO TABS: 50.0000 mg | ORAL_TABLET | Freq: Four times a day (QID) | ORAL | 0 refills | Status: DC | PRN

## 2017-04-21 NOTE — Addendum Note (Signed)
Addended by: Abbe AmsterdamOPLAND, Ardyth Kelso C on: 04/21/2017 06:18 PM   Modules accepted: Orders

## 2017-04-21 NOTE — Telephone Encounter (Signed)
Dr. Patsy Lageropland,  Please advise in Dr. Maryln GottronLowne-Chase's absence.  Thanks, Campbell Soupshlee

## 2017-04-26 ENCOUNTER — Encounter: Payer: Self-pay | Admitting: Family Medicine

## 2017-04-29 ENCOUNTER — Other Ambulatory Visit: Payer: Self-pay | Admitting: Family Medicine

## 2017-04-29 MED ORDER — HYDROCODONE-ACETAMINOPHEN 7.5-325 MG PO TABS: 1.0000 | ORAL_TABLET | Freq: Four times a day (QID) | ORAL | 0 refills | Status: DC | PRN

## 2017-05-03 ENCOUNTER — Other Ambulatory Visit: Payer: Self-pay | Admitting: Family Medicine

## 2017-05-04 ENCOUNTER — Encounter: Payer: Self-pay | Admitting: Family Medicine

## 2017-05-04 NOTE — Telephone Encounter (Signed)
Mild disc bulge and arthritic changes only

## 2017-05-04 NOTE — Telephone Encounter (Signed)
Look at email from 04/26/17

## 2017-05-04 NOTE — Telephone Encounter (Signed)
Mild disc bulge and arthritic changes only--- [facility] or if she prefers to go to [facility] we can do that

## 2017-05-10 ENCOUNTER — Other Ambulatory Visit: Payer: Self-pay | Admitting: Family Medicine

## 2017-05-10 ENCOUNTER — Encounter: Payer: Self-pay | Admitting: Family Medicine

## 2017-05-10 DIAGNOSIS — M48061 Spinal stenosis, lumbar region without neurogenic claudication: Secondary | ICD-10-CM

## 2017-05-10 NOTE — Telephone Encounter (Signed)
She has some stenosis--- she should see neurosurgeon --- may need new referral

## 2017-05-13 ENCOUNTER — Encounter: Payer: Self-pay | Admitting: Family Medicine

## 2017-05-13 ENCOUNTER — Other Ambulatory Visit: Payer: Self-pay

## 2017-05-13 DIAGNOSIS — F339 Major depressive disorder, recurrent, unspecified: Secondary | ICD-10-CM

## 2017-05-13 MED ORDER — VORTIOXETINE HBR 10 MG PO TABS: 10.0000 mg | ORAL_TABLET | Freq: Every day | ORAL | 0 refills | Status: DC

## 2017-05-13 NOTE — Telephone Encounter (Signed)
Faxed refill of Trintellix/thx dmf

## 2017-05-14 ENCOUNTER — Telehealth: Payer: Managed Care, Other (non HMO) | Admitting: Family

## 2017-05-14 ENCOUNTER — Encounter: Payer: Self-pay | Admitting: Family Medicine

## 2017-05-14 DIAGNOSIS — M5440 Lumbago with sciatica, unspecified side: Secondary | ICD-10-CM

## 2017-05-14 MED ORDER — HYDROMORPHONE HCL 4 MG PO TABS: 4.0000 mg | ORAL_TABLET | Freq: Four times a day (QID) | ORAL | 0 refills | Status: DC | PRN

## 2017-05-14 NOTE — Telephone Encounter (Signed)
Rx printed, placed at front desk for pick up at Pt's convenience. Pt informed via MyChart.

## 2017-05-14 NOTE — Progress Notes (Signed)
Thank you for the details you put in the comment boxes. Those details really help [place] take better care of you. This is very concerning and is beyond the scope of this health program. We are only able to give medications which are much weaker than the ones you already have. This needs to be evaluated by a professional who has access to your imaging records that you mentioned.  Based on what you shared with me it looks like you have a serious condition that should be evaluated in a face to face office visit.  NOTE: Even if you have entered your credit card information for this eVisit, you will not be charged.   If you are having a true medical emergency please call 911.  If you need an urgent face to face visit, [place] has four urgent care centers for your convenience.  If you need care fast and have a high deductible or no insurance consider:   [email or web address]  [phone]  [address], [address] [place], [place] [address] 8 am to 8 pm Monday-Friday 10 am to 4 pm Saturday-Sunday   The following sites will take your  insurance:    . [facility]  [phone] Get Driving Directions Find a Provider at this Location  [address] [place], [place] [address] . 10 am to 8 pm Monday-Friday . 12 pm to 8 pm Saturday-Sunday   . [facility] at [facility]  [phone] Get Driving Directions Find a Provider at this Location  [address], [address] [place], [place] [address] . 8 am to 8 pm Monday-Friday . 9 am to 6 pm Saturday . 11 am to 6 pm Sunday   . [facility] at [facility]  [phone] Get Driving Directions  [address] [place], [place] [address] . 8 am to 8 pm Monday-Friday . 8 am to 4 pm Saturday-Sunday   Your e-visit answers were reviewed by a board certified advanced clinical practitioner to complete your personal care plan.  Thank you for using e-Visits.

## 2017-05-14 NOTE — Telephone Encounter (Signed)
Dilaudid 4 mg 1 po q6h #30  If she is unable to pick it up ---- it can not be faxed ---- the only other thing to recommend is the ER to get some help over the weekend

## 2017-05-20 ENCOUNTER — Other Ambulatory Visit: Payer: Self-pay | Admitting: Family Medicine

## 2017-05-20 DIAGNOSIS — F411 Generalized anxiety disorder: Secondary | ICD-10-CM

## 2017-05-21 MED ORDER — CLONAZEPAM 1 MG PO TABS: 1.0000 mg | ORAL_TABLET | Freq: Two times a day (BID) | ORAL | 0 refills | Status: DC | PRN

## 2017-05-21 NOTE — Telephone Encounter (Signed)
This is being signed and faxed per provider/thx dmf

## 2017-05-21 NOTE — Telephone Encounter (Signed)
Printed to fax to pharm when signed per YL/thx dmf

## 2017-05-25 ENCOUNTER — Other Ambulatory Visit: Payer: Self-pay | Admitting: Family Medicine

## 2017-05-25 NOTE — Telephone Encounter (Signed)
This had already been faxed in August but last fill date is 7.10.18/will approve if I receive response that they did not rec August Rx/thx dmf

## 2017-05-31 ENCOUNTER — Encounter: Payer: Self-pay | Admitting: Family Medicine

## 2017-05-31 ENCOUNTER — Telehealth: Payer: Self-pay | Admitting: Family Medicine

## 2017-05-31 NOTE — Telephone Encounter (Signed)
Refill x1 

## 2017-05-31 NOTE — Telephone Encounter (Signed)
Pt is requesting refill on Hydrocodone 7.5-325mg .   Last OV: 05/14/2017 e-visit Last Fill: 04/29/2017 #28 and 0RF by Dr. Abner GreenspanBlyth UDS: 01/13/2017 Low risk   database 04/06/2017 (in media)  Please advise.

## 2017-06-01 MED ORDER — HYDROCODONE-ACETAMINOPHEN 7.5-325 MG PO TABS: 1.0000 | ORAL_TABLET | Freq: Four times a day (QID) | ORAL | 0 refills | Status: DC | PRN

## 2017-06-01 MED ORDER — HYDROMORPHONE HCL 4 MG PO TABS: 4.0000 mg | ORAL_TABLET | Freq: Four times a day (QID) | ORAL | 0 refills | Status: DC | PRN

## 2017-06-01 NOTE — Telephone Encounter (Signed)
Ok to refill dilaudid and destroy hydrocodone Neuro will take over once she gets an appointment

## 2017-06-01 NOTE — Telephone Encounter (Signed)
Pt wanting to know if you will actually give her Dilaudid like last time since Hydrocodone does not help?

## 2017-06-01 NOTE — Telephone Encounter (Signed)
Rx printed, awaiting DO signature.  

## 2017-06-01 NOTE — Telephone Encounter (Signed)
Hydrocodone Rx destroyed, Dilaudid Rx placed at front desk. Pt informed via MyChart.

## 2017-06-01 NOTE — Addendum Note (Signed)
Addended byConrad Compton: Zunairah Devers D on: 06/01/2017 11:18 AM   Modules accepted: Orders

## 2017-06-01 NOTE — Telephone Encounter (Signed)
Pt informed that Rx has been placed at front desk for pick up at her convenience.  

## 2017-06-07 ENCOUNTER — Encounter: Payer: Self-pay | Admitting: Family Medicine

## 2017-06-15 ENCOUNTER — Other Ambulatory Visit: Payer: Self-pay | Admitting: Family Medicine

## 2017-06-15 ENCOUNTER — Encounter: Payer: Self-pay | Admitting: Family Medicine

## 2017-06-15 DIAGNOSIS — M5440 Lumbago with sciatica, unspecified side: Secondary | ICD-10-CM

## 2017-06-15 DIAGNOSIS — M48061 Spinal stenosis, lumbar region without neurogenic claudication: Secondary | ICD-10-CM

## 2017-06-15 NOTE — Telephone Encounter (Signed)
Second opinion referral placed to [facility] for Spinal Stenosis.

## 2017-06-15 NOTE — Telephone Encounter (Signed)
i'm sorry that happened. I'm happy to ask for a second opinion at [facility]  The only other option if surgery is not an option is pain management

## 2017-06-24 ENCOUNTER — Ambulatory Visit (INDEPENDENT_AMBULATORY_CARE_PROVIDER_SITE_OTHER): Payer: Managed Care, Other (non HMO) | Admitting: Family Medicine

## 2017-06-24 ENCOUNTER — Other Ambulatory Visit (HOSPITAL_COMMUNITY)
Admission: RE | Admit: 2017-06-24 | Discharge: 2017-06-24 | Disposition: A | Discharge status: Home / Self Care | Payer: Managed Care, Other (non HMO) | Source: Ambulatory Visit | Attending: Family Medicine | Admitting: Family Medicine

## 2017-06-24 ENCOUNTER — Encounter: Payer: Self-pay | Admitting: Family Medicine

## 2017-06-24 VITALS — BP 126/74 | HR 114 | Temp 98.7°F | Ht 62.0 in | Wt 216.0 lb

## 2017-06-24 DIAGNOSIS — F988 Other specified behavioral and emotional disorders with onset usually occurring in childhood and adolescence: Secondary | ICD-10-CM

## 2017-06-24 DIAGNOSIS — N898 Other specified noninflammatory disorders of vagina: Secondary | ICD-10-CM

## 2017-06-24 DIAGNOSIS — M545 Low back pain: Secondary | ICD-10-CM | POA: Diagnosis not present

## 2017-06-24 DIAGNOSIS — M79604 Pain in right leg: Secondary | ICD-10-CM

## 2017-06-24 DIAGNOSIS — Z23 Encounter for immunization: Secondary | ICD-10-CM

## 2017-06-24 DIAGNOSIS — Z79899 Other long term (current) drug therapy: Secondary | ICD-10-CM | POA: Diagnosis not present

## 2017-06-24 DIAGNOSIS — F411 Generalized anxiety disorder: Secondary | ICD-10-CM

## 2017-06-24 DIAGNOSIS — M5441 Lumbago with sciatica, right side: Secondary | ICD-10-CM

## 2017-06-24 MED ORDER — HYDROMORPHONE HCL 4 MG PO TABS: 4.0000 mg | ORAL_TABLET | Freq: Four times a day (QID) | ORAL | 0 refills | Status: DC | PRN

## 2017-06-24 MED ORDER — AMPHETAMINE-DEXTROAMPHET ER 30 MG PO CP24: 60.0000 mg | ORAL_CAPSULE | ORAL | 0 refills | Status: DC

## 2017-06-24 MED ORDER — CLONAZEPAM 1 MG PO TABS: 1.0000 mg | ORAL_TABLET | Freq: Two times a day (BID) | ORAL | 0 refills | Status: DC | PRN

## 2017-06-24 NOTE — Patient Instructions (Signed)

## 2017-06-24 NOTE — Progress Notes (Signed)
Patient ID: Veronica Yates, female    DOB: 02/05/1971  Age: 46 y.o. MRN: [id number]    Subjective:  Subjective  HPI Veronica Yates presents for f/u from neuro surgeon.  She already has an appointment with new NS at [facility] and is excited for a 2nd opinion.    Review of Systems  Constitutional: Negative for chills and fever.  HENT: Negative for congestion and hearing loss.   Eyes: Negative for discharge.  Respiratory: Negative for cough and shortness of breath.   Cardiovascular: Negative for chest pain, palpitations and leg swelling.  Gastrointestinal: Negative for abdominal pain, blood in stool, constipation, diarrhea, nausea and vomiting.  Genitourinary: Negative for dysuria, frequency, hematuria and urgency.  Musculoskeletal: Positive for back pain. Negative for myalgias.  Skin: Negative for rash.  Allergic/Immunologic: Negative for environmental allergies.  Neurological: Negative for dizziness, weakness and headaches.  Hematological: Does not bruise/bleed easily.  Psychiatric/Behavioral: Negative for suicidal ideas. The patient is not nervous/anxious.     History Past Medical History:  Diagnosis Date  . Depression   . GERD (gastroesophageal reflux disease)     She has a past surgical history that includes dnc; tubal reversal; Tonsillectomy; Cesarean section; and laparascopy.   Her family history includes Bipolar disorder in her sister; Depression in her unknown relative; Diabetes in her father and mother.She reports that she has never smoked. She has never used smokeless tobacco. She reports that she does not drink alcohol or use drugs.  Current Outpatient Prescriptions on File Prior to Visit  Medication Sig Dispense Refill  . clindamycin (CLEOCIN T) 1 % lotion APPLY TO AFFECTED AREA TWICE A DAY 60 mL 1  . pantoprazole (PROTONIX) 40 MG tablet TAKE 1 TABLET (40 MG TOTAL) BY MOUTH DAILY. 30 tablet 1   No current facility-administered medications on file prior to visit.      Objective:  Objective  Physical Exam  Constitutional: She is oriented to person, place, and time. She appears well-developed and well-nourished.  HENT:  Head: Normocephalic and atraumatic.  Eyes: Conjunctivae and EOM are normal.  Neck: Normal range of motion. Neck supple. No JVD present. Carotid bruit is not present. No thyromegaly present.  Cardiovascular: Normal rate, regular rhythm and normal heart sounds.   No murmur heard. Pulmonary/Chest: Effort normal and breath sounds normal. No respiratory distress. She has no wheezes. She has no rales. She exhibits no tenderness.  Musculoskeletal: She exhibits tenderness. She exhibits no edema.       Lumbar back: She exhibits tenderness, pain and spasm.  Neurological: She is alert and oriented to person, place, and time. She has normal reflexes. She displays normal reflexes.  Psychiatric: She has a normal mood and affect.  Nursing note and vitals reviewed.  BP 126/74 (BP Location: Right Arm, Patient Position: Sitting, Cuff Size: Normal)   Pulse (!) 114   Temp 98.7 F (37.1 C) (Oral)   Ht  (1.575 m)   Wt 216 lb (98 kg)   LMP 05/29/2017   SpO2 98%   BMI 39.51 kg/m  Wt Readings from Last 3 Encounters:  06/24/17 216 lb (98 kg)  04/06/17 205 lb 8 oz (93.2 kg)  02/09/17 199 lb (90.3 kg)     Lab Results  Component Value Date   WBC 6.3 01/01/2017   HGB 12.8 01/01/2017   HCT 37.7 01/01/2017   PLT 328.0 01/01/2017   GLUCOSE 92 01/01/2017   CHOL 247 (H) 09/25/2016   TRIG 95 09/25/2016   HDL 67 09/25/2016  LDLCALC 161 (H) 09/25/2016   ALT 26 01/01/2017   AST 23 01/01/2017   NA 135 01/01/2017   K 3.9 01/01/2017   CL 103 01/01/2017   CREATININE 0.52 01/01/2017   BUN 19 01/01/2017   CO2 25 01/01/2017   TSH 1.30 01/01/2017    Dg Lumbar Spine Complete  Result Date: 04/06/2017 CLINICAL DATA:  46 year old female with lumbar spine region pain for 3 months. Numbness and tingling right lower extremity. No known injury. Initial  encounter. EXAM: LUMBAR SPINE - COMPLETE 4+ VIEW COMPARISON:  03/31/2007 MR. FINDINGS: Slightly transitional appearance S1 vertebral body with rudimentary disc at the S1-2 level. Last fully open disc space L5-S1 level. Normal alignment lumbar spine without significant disc space narrowing. No pars defect detected. IMPRESSION: Normal alignment without significant disc space narrowing. 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 Electronically Signed   By: Lacy Duverney M.D.   On: 04/06/2017 19:10     Assessment & Plan:  Plan  I have discontinued Veronica Yates's meloxicam, Levomilnacipran HCl ER, and Levomilnacipran HCl ER. I have also changed her amphetamine-dextroamphetamine, amphetamine-dextroamphetamine, and amphetamine-dextroamphetamine. Additionally, I am having her start on HYDROmorphone and HYDROmorphone. Lastly, I am having her maintain her pantoprazole, clindamycin, vortioxetine HBr, clonazePAM, and HYDROmorphone.  Meds ordered this encounter  Medications  . vortioxetine HBr (TRINTELLIX) 20 MG TABS    Sig: Take 20 mg by mouth. Take 2qd  . amphetamine-dextroamphetamine (ADDERALL XR) 30 MG 24 hr capsule    Sig: Take 2 capsules (60 mg total) by mouth every morning. Take 2qam; October    Dispense:  60 capsule    Refill:  0  . amphetamine-dextroamphetamine (ADDERALL XR) 30 MG 24 hr capsule    Sig: Take 2 capsules (60 mg total) by mouth every morning. Take 2qam; November    Dispense:  60 capsule    Refill:  0  . amphetamine-dextroamphetamine (ADDERALL XR) 30 MG 24 hr capsule    Sig: Take 2 capsules (60 mg total) by mouth every morning. Take 2qam; December    Dispense:  60 capsule    Refill:  0  . clonazePAM (KLONOPIN) 1 MG tablet    Sig: Take 1 tablet (1 mg total) by mouth 2 (two) times daily as needed.    Dispense:  60 tablet    Refill:  0    Not to exceed 5 additional fills before 08/29/2017  . HYDROmorphone (DILAUDID) 4 MG tablet    Sig: Take 1 tablet (4 mg total) by mouth every 6 (six) hours as  needed for severe pain.    Dispense:  60 tablet    Refill:  0  . HYDROmorphone (DILAUDID) 4 MG tablet    Sig: Take 1 tablet (4 mg total) by mouth every 6 (six) hours as needed for severe pain.    Dispense:  60 tablet    Refill:  0    Do not fill until Jul 25, 2017  . HYDROmorphone (DILAUDID) 4 MG tablet    Sig: Take 1 tablet (4 mg total) by mouth every 6 (six) hours as needed for severe pain.    Dispense:  30 tablet    Refill:  0    Do not fill until Aug 24, 2017    Problem List Items Addressed This Visit      Unprioritized   ADD (attention deficit disorder)    Refills given       Relevant Medications   amphetamine-dextroamphetamine (ADDERALL XR) 30 MG 24 hr capsule   amphetamine-dextroamphetamine (ADDERALL  XR) 30 MG 24 hr capsule   amphetamine-dextroamphetamine (ADDERALL XR) 30 MG 24 hr capsule   Other Relevant Orders   Pain Mgmt, Profile 8 w/Conf, U   Low back pain radiating to both legs    D/w with pt controlled substance protochol 3 rx given to pt --- NS to take over if they are able to help pt uds  Contract scanned in Database reviewed      Relevant Medications   HYDROmorphone (DILAUDID) 4 MG tablet   HYDROmorphone (DILAUDID) 4 MG tablet   HYDROmorphone (DILAUDID) 4 MG tablet    Other Visit Diagnoses    Acute right-sided low back pain with right-sided sciatica    -  Primary   Relevant Medications   HYDROmorphone (DILAUDID) 4 MG tablet   HYDROmorphone (DILAUDID) 4 MG tablet   HYDROmorphone (DILAUDID) 4 MG tablet   Generalized anxiety disorder       Relevant Medications   vortioxetine HBr (TRINTELLIX) 20 MG TABS   clonazePAM (KLONOPIN) 1 MG tablet   Other Relevant Orders   Pain Mgmt, Profile 8 w/Conf, U   Need for immunization against influenza       Relevant Orders   Flu Vaccine QUAD 36+ mos IM (Completed)   Vaginal discharge       Relevant Orders   Urine cytology ancillary only   High risk medication use       Relevant Orders   Pain Mgmt, Profile 8  w/Conf, U      Follow-up: Return in about 3 months (around 09/24/2017), or if symptoms worsen or fail to improve.  Donato Schultz, DO

## 2017-06-27 NOTE — Assessment & Plan Note (Signed)
Refills given.

## 2017-06-27 NOTE — Assessment & Plan Note (Signed)
D/w with pt controlled substance protochol 3 rx given to pt --- NS to take over if they are able to help pt uds  Contract scanned in Database reviewed

## 2017-06-28 LAB — PAIN MGMT, PROFILE 8 W/CONF, U
6 Acetylmorphine: NEGATIVE ng/mL (ref <10)
ALCOHOL METABOLITES: NEGATIVE ng/mL (ref <500)
ALPHAHYDROXYALPRAZOLAM: NEGATIVE ng/mL (ref <25)
ALPHAHYDROXYTRIAZOLAM: NEGATIVE ng/mL (ref <50)
AMPHETAMINE: 10203 ng/mL — AB (ref <250)
Alphahydroxymidazolam: NEGATIVE ng/mL (ref <50)
Aminoclonazepam: 543 ng/mL — ABNORMAL HIGH (ref <25)
Amphetamines: POSITIVE ng/mL — AB (ref <500)
BENZODIAZEPINES: POSITIVE ng/mL — AB (ref <100)
BUPRENORPHINE, URINE: NEGATIVE ng/mL (ref <5)
COCAINE METABOLITE: NEGATIVE ng/mL (ref <150)
CREATININE: 119.9 mg/dL
HYDROXYETHYLFLURAZEPAM: NEGATIVE ng/mL (ref <50)
LORAZEPAM: NEGATIVE ng/mL (ref <50)
MARIJUANA METABOLITE: NEGATIVE ng/mL (ref <20)
MDMA: NEGATIVE ng/mL (ref <500)
METHAMPHETAMINE: NEGATIVE ng/mL (ref <250)
Nordiazepam: NEGATIVE ng/mL (ref <50)
OXIDANT: NEGATIVE ug/mL (ref <200)
Opiates: NEGATIVE ng/mL (ref <100)
Oxazepam: NEGATIVE ng/mL (ref <50)
Oxycodone: NEGATIVE ng/mL (ref <100)
PH: 7.2 (ref 4.5–9.0)
Temazepam: NEGATIVE ng/mL (ref <50)

## 2017-06-28 LAB — URINE CYTOLOGY ANCILLARY ONLY: Trichomonas: NEGATIVE

## 2017-06-30 LAB — URINE CYTOLOGY ANCILLARY ONLY
Bacterial vaginitis: NEGATIVE
Candida vaginitis: NEGATIVE

## 2017-07-15 ENCOUNTER — Other Ambulatory Visit: Payer: Self-pay | Admitting: Family Medicine

## 2017-07-15 DIAGNOSIS — F339 Major depressive disorder, recurrent, unspecified: Secondary | ICD-10-CM

## 2017-07-16 ENCOUNTER — Encounter: Payer: Self-pay | Admitting: Family Medicine

## 2017-07-19 ENCOUNTER — Encounter: Payer: Self-pay | Admitting: Family Medicine

## 2017-07-19 MED ORDER — VORTIOXETINE HBR 20 MG PO TABS: 40.0000 mg | ORAL_TABLET | Freq: Every day | ORAL | 3 refills | Status: DC

## 2017-07-19 NOTE — Telephone Encounter (Signed)
Pt has been out for 5 days TRINTELLIX  and feels horrible. Pt uses [facility] [place]. Pt needs TRINTELLIX refilled she says she never got a script for it when here [birth year] and pharmacy says [facility] denied it due to getting it on last visit 06/24/17. Pt cell [phone]. The last script in chart says "historical Provider" so unsure it was printed and/or ordered by [facility]. Please call pt.

## 2017-08-17 ENCOUNTER — Other Ambulatory Visit: Payer: Self-pay | Admitting: Family Medicine

## 2017-08-17 DIAGNOSIS — F411 Generalized anxiety disorder: Secondary | ICD-10-CM

## 2017-08-23 ENCOUNTER — Other Ambulatory Visit: Payer: Self-pay | Admitting: Family Medicine

## 2017-08-23 ENCOUNTER — Encounter: Payer: Self-pay | Admitting: Family Medicine

## 2017-08-23 ENCOUNTER — Telehealth: Payer: Self-pay | Admitting: Family Medicine

## 2017-08-23 DIAGNOSIS — F411 Generalized anxiety disorder: Secondary | ICD-10-CM

## 2017-08-23 NOTE — Telephone Encounter (Signed)
Should be refills on med note states:Not to exceed 5 additional fills before 08/29/2017  Pt to call pharmacy

## 2017-08-23 NOTE — Telephone Encounter (Signed)
Copied from CRM [id number]. Topic: Inquiry >> Aug 23, 2017 12:34 PM Veronica Yates, Veronica Yates wrote: Reason for CRM: pt is needing a response or the order sent for the Rx request of Klonopin, contact pt or pharmacy if needed

## 2017-08-24 MED ORDER — CLONAZEPAM 1 MG PO TABS: 1.0000 mg | ORAL_TABLET | Freq: Two times a day (BID) | ORAL | 1 refills | Status: DC | PRN

## 2017-08-24 NOTE — Telephone Encounter (Signed)
Patient mycharted.  rx faxed to pharmacy.

## 2017-08-24 NOTE — Telephone Encounter (Signed)
Refill x1---1 refills 

## 2017-08-24 NOTE — Telephone Encounter (Signed)
Patient requesting refill for clonazepam  Database ran and is on your desk for review  Last filled per database: 06/24/17 Last written: 06/24/17 Last ov: 06/24/17 Next ov: none Contract: 01/11/17 UDS: next due 12/23/17

## 2017-08-24 NOTE — Telephone Encounter (Signed)
Relation to pt: self  Call back number:[phone] Pharmacy: [id number] [place], [place] - [address] [phone] (Phone) [phone] (Fax)     Reason for call:  Patient spoke with pharmacy today, pharmacy denied any refills, pharmacy requesting clonazePAM (KLONOPIN) 1 MG tablet. Patient states she's been for 1 week, please advise

## 2017-08-24 NOTE — Telephone Encounter (Signed)
Conversation: Medication Refill  (Newest Message First)  Garrison [place]llington, Sarah E, RN      08/23/17 2:13 PM  Note    Should be refills on med note states:Not to exceed 5 additional fills before 08/29/2017  Pt to call pharmacy          08/23/17 12:37 PM  Alexander BergeronBarksdale, Harvey B routed this conversation to [facility]  Jonette EvaBarksdale, Harvey B      08/23/17 12:36 PM  Note    Copied from CRM [id number]. Topic: Inquiry >> Aug 23, 2017 12:34 PM Alexander BergeronBarksdale, Harvey B wrote: Reason for CRM: pt is needing a response or the order sent for the Rx request of Klonopin, contact pt or pharmacy if needed

## 2017-09-06 ENCOUNTER — Encounter: Payer: Self-pay | Admitting: Family Medicine

## 2017-09-08 ENCOUNTER — Encounter: Payer: Self-pay | Admitting: Family Medicine

## 2017-09-08 NOTE — Telephone Encounter (Signed)
Duplicate message. 

## 2017-09-09 ENCOUNTER — Other Ambulatory Visit: Payer: Self-pay | Admitting: Family Medicine

## 2017-09-09 DIAGNOSIS — M5441 Lumbago with sciatica, right side: Secondary | ICD-10-CM

## 2017-09-09 MED ORDER — HYDROMORPHONE HCL 4 MG PO TABS: 4.0000 mg | ORAL_TABLET | Freq: Four times a day (QID) | ORAL | 0 refills | Status: DC | PRN

## 2017-09-09 NOTE — Telephone Encounter (Signed)
We do need to see pt q8354m now for pain meds--- I have sent one in so she does not run out but will need to see her in the office Sorry for inconvenience but with the new law we need to check certain things q4254m  I'll send the other 2 in when we see her

## 2017-10-22 ENCOUNTER — Ambulatory Visit (INDEPENDENT_AMBULATORY_CARE_PROVIDER_SITE_OTHER): Payer: Managed Care, Other (non HMO) | Admitting: Family Medicine

## 2017-10-22 ENCOUNTER — Encounter: Payer: Self-pay | Admitting: Family Medicine

## 2017-10-22 VITALS — BP 132/82 | HR 85 | Temp 98.6°F | Resp 16 | Ht 62.0 in | Wt 221.8 lb

## 2017-10-22 DIAGNOSIS — F988 Other specified behavioral and emotional disorders with onset usually occurring in childhood and adolescence: Secondary | ICD-10-CM | POA: Diagnosis not present

## 2017-10-22 DIAGNOSIS — F329 Major depressive disorder, single episode, unspecified: Secondary | ICD-10-CM | POA: Diagnosis not present

## 2017-10-22 DIAGNOSIS — M5441 Lumbago with sciatica, right side: Secondary | ICD-10-CM

## 2017-10-22 DIAGNOSIS — M545 Low back pain, unspecified: Secondary | ICD-10-CM

## 2017-10-22 DIAGNOSIS — F411 Generalized anxiety disorder: Secondary | ICD-10-CM

## 2017-10-22 DIAGNOSIS — F419 Anxiety disorder, unspecified: Secondary | ICD-10-CM

## 2017-10-22 DIAGNOSIS — L700 Acne vulgaris: Secondary | ICD-10-CM | POA: Diagnosis not present

## 2017-10-22 DIAGNOSIS — F341 Dysthymic disorder: Secondary | ICD-10-CM

## 2017-10-22 DIAGNOSIS — M79604 Pain in right leg: Secondary | ICD-10-CM

## 2017-10-22 DIAGNOSIS — F32A Depression, unspecified: Secondary | ICD-10-CM

## 2017-10-22 MED ORDER — CLINDAMYCIN PHOSPHATE 1 % EX LOTN: TOPICAL_LOTION | CUTANEOUS | 1 refills | Status: DC

## 2017-10-22 MED ORDER — BUPROPION HCL ER (XL) 150 MG PO TB24: ORAL_TABLET | ORAL | 2 refills | Status: DC

## 2017-10-22 MED ORDER — AMPHETAMINE-DEXTROAMPHET ER 30 MG PO CP24: 60.0000 mg | ORAL_CAPSULE | ORAL | 0 refills | Status: DC

## 2017-10-22 MED ORDER — CLONAZEPAM 1 MG PO TABS: 1.0000 mg | ORAL_TABLET | Freq: Two times a day (BID) | ORAL | 1 refills | Status: DC | PRN

## 2017-10-22 MED ORDER — VORTIOXETINE HBR 10 MG PO TABS: 1.0000 | ORAL_TABLET | Freq: Every day | ORAL | 0 refills | Status: DC

## 2017-10-22 MED ORDER — SERTRALINE HCL 100 MG PO TABS: ORAL_TABLET | ORAL | 5 refills | Status: DC

## 2017-10-22 MED ORDER — HYDROMORPHONE HCL 4 MG PO TABS: 4.0000 mg | ORAL_TABLET | Freq: Four times a day (QID) | ORAL | 0 refills | Status: DC | PRN

## 2017-10-22 NOTE — Patient Instructions (Signed)

## 2017-10-22 NOTE — Assessment & Plan Note (Signed)
Refill pain meds Pt needs to reschedule [facility] ov -- cancelled due to snow

## 2017-10-22 NOTE — Assessment & Plan Note (Signed)
Wean off trinlellix  Then restart zoloft 50 mg daily for 1 week then 100 mg daily--- will get her up to 200 mg again and slowing inc wellbutrin to 450 if needed D/w pt that we may need to get her in to see psych if no improvement

## 2017-10-22 NOTE — Progress Notes (Signed)
Patient ID: Veronica Yates, female   DOB: October 31, 1970, 47 y.o.   MRN: [id number]     Subjective:  I acted as a Neurosurgeon for Dr. Zola Button.  Apolonio Schneiders, CMA   Patient ID: Veronica Yates, female    DOB: [birth year], 47 y.o.   MRN: [id number]  Chief Complaint  Patient presents with  . Depression    wants to change trintellix    HPI  Patient is in today for follow up depression.  She would like to change Trintellix.  Patient Care Team: Zola Button, Grayling Congress, DO as PCP - General Carrington Clamp, MD as Consulting Physician (Obstetrics and Gynecology)   Past Medical History:  Diagnosis Date  . Depression   . GERD (gastroesophageal reflux disease)     Past Surgical History:  Procedure Laterality Date  . CESAREAN SECTION    . dnc    . laparascopy    . TONSILLECTOMY    . tubal reversal      Family History  Problem Relation Age of Onset  . Depression Unknown   . Bipolar disorder Sister   . Diabetes Mother   . Diabetes Father     Social History   Socioeconomic History  . Marital status: Married    Spouse name: Not on file  . Number of children: Not on file  . Years of education: Not on file  . Highest education level: Not on file  Social Needs  . Financial resource strain: Not on file  . Food insecurity - worry: Not on file  . Food insecurity - inability: Not on file  . Transportation needs - medical: Not on file  . Transportation needs - non-medical: Not on file  Occupational History  . Occupation: [employer]  Tobacco Use  . Smoking status: Never Smoker  . Smokeless tobacco: Never Used  Substance and Sexual Activity  . Alcohol use: No  . Drug use: No  . Sexual activity: Not on file  Other Topics Concern  . Not on file  Social History Narrative  . Not on file    Outpatient Medications Prior to Visit  Medication Sig Dispense Refill  . HYDROmorphone (DILAUDID) 4 MG tablet Take 1 tablet (4 mg total) by mouth every 6 (six) hours as needed for  severe pain. 60 tablet 0  . HYDROmorphone (DILAUDID) 4 MG tablet Take 1 tablet (4 mg total) by mouth every 6 (six) hours as needed for severe pain. 30 tablet 0  . pantoprazole (PROTONIX) 40 MG tablet TAKE 1 TABLET (40 MG TOTAL) BY MOUTH DAILY. 30 tablet 1  . amphetamine-dextroamphetamine (ADDERALL XR) 30 MG 24 hr capsule Take 2 capsules (60 mg total) by mouth every morning. Take 2qam; October 60 capsule 0  . amphetamine-dextroamphetamine (ADDERALL XR) 30 MG 24 hr capsule Take 2 capsules (60 mg total) by mouth every morning. Take 2qam; November 60 capsule 0  . amphetamine-dextroamphetamine (ADDERALL XR) 30 MG 24 hr capsule Take 2 capsules (60 mg total) by mouth every morning. Take 2qam; December 60 capsule 0  . clindamycin (CLEOCIN T) 1 % lotion APPLY TO AFFECTED AREA TWICE A DAY 60 mL 1  . clonazePAM (KLONOPIN) 1 MG tablet Take 1 tablet (1 mg total) by mouth 2 (two) times daily as needed. 60 tablet 1  . HYDROmorphone (DILAUDID) 4 MG tablet Take 1 tablet (4 mg total) by mouth every 6 (six) hours as needed for severe pain. 60 tablet 0  . vortioxetine HBr (TRINTELLIX) 20 MG TABS Take 20 mg by  mouth daily.     No facility-administered medications prior to visit.     Allergies  Allergen Reactions  . Penicillins     Unknown--reaction as an infant    Review of Systems  Constitutional: Negative for chills, fever and malaise/fatigue.  HENT: Negative for congestion and hearing loss.   Eyes: Negative for discharge.  Respiratory: Negative for cough, sputum production and shortness of breath.   Cardiovascular: Negative for chest pain, palpitations and leg swelling.  Gastrointestinal: Negative for abdominal pain, blood in stool, constipation, diarrhea, heartburn, nausea and vomiting.  Genitourinary: Negative for dysuria, frequency, hematuria and urgency.  Musculoskeletal: Negative for back pain, falls and myalgias.  Skin: Negative for rash.  Neurological: Negative for dizziness, sensory change, loss  of consciousness, weakness and headaches.  Endo/Heme/Allergies: Negative for environmental allergies. Does not bruise/bleed easily.  Psychiatric/Behavioral: Positive for depression. Negative for suicidal ideas. The patient is not nervous/anxious and does not have insomnia.        Objective:    Physical Exam  Constitutional: She is oriented to person, place, and time. She appears well-developed and well-nourished.  HENT:  Head: Normocephalic and atraumatic.  Eyes: Conjunctivae and EOM are normal.  Neck: Normal range of motion. Neck supple. No JVD present. Carotid bruit is not present. No thyromegaly present.  Cardiovascular: Normal rate, regular rhythm and normal heart sounds.  No murmur heard. Pulmonary/Chest: Effort normal and breath sounds normal. No respiratory distress. She has no wheezes. She has no rales. She exhibits no tenderness.  Musculoskeletal: She exhibits no edema.  Neurological: She is alert and oriented to person, place, and time.  Psychiatric: She has a normal mood and affect.  Nursing note and vitals reviewed.   BP 132/82   Pulse 85   Temp 98.6 F (37 C) (Oral)   Resp 16   Ht 5\' 2"  (1.575 m)   Wt 221 lb 12.8 oz (100.6 kg)   LMP 09/26/2017   SpO2 99%   BMI 40.57 kg/m  Wt Readings from Last 3 Encounters:  10/22/17 221 lb 12.8 oz (100.6 kg)  06/24/17 216 lb (98 kg)  04/06/17 205 lb 8 oz (93.2 kg)   BP Readings from Last 3 Encounters:  10/22/17 132/82  06/24/17 126/74  04/06/17 120/68     Immunization History  Administered Date(s) Administered  . Influenza,inj,Quad PF,6+ Mos 09/11/2014, 10/11/2015, 10/27/2016, 06/24/2017  . Td 06/29/2006  . Tdap 10/27/2016    Health Maintenance  Topic Date Due  . HIV Screening  06/13/1984  . PAP SMEAR  06/13/1990  . MAMMOGRAM  03/26/2017  . TETANUS/TDAP  10/27/2026  . INFLUENZA VACCINE  Completed    Lab Results  Component Value Date   WBC 6.3 01/01/2017   HGB 12.8 01/01/2017   HCT 37.7 01/01/2017   PLT  328.0 01/01/2017   GLUCOSE 92 01/01/2017   CHOL 247 (H) 09/25/2016   TRIG 95 09/25/2016   HDL 67 09/25/2016   LDLCALC 161 (H) 09/25/2016   ALT 26 01/01/2017   AST 23 01/01/2017   NA 135 01/01/2017   K 3.9 01/01/2017   CL 103 01/01/2017   CREATININE 0.52 01/01/2017   BUN 19 01/01/2017   CO2 25 01/01/2017   TSH 1.30 01/01/2017    Lab Results  Component Value Date   TSH 1.30 01/01/2017   Lab Results  Component Value Date   WBC 6.3 01/01/2017   HGB 12.8 01/01/2017   HCT 37.7 01/01/2017   MCV 86.4 01/01/2017   PLT 328.0 01/01/2017  Lab Results  Component Value Date   NA 135 01/01/2017   K 3.9 01/01/2017   CO2 25 01/01/2017   GLUCOSE 92 01/01/2017   BUN 19 01/01/2017   CREATININE 0.52 01/01/2017   BILITOT 0.4 01/01/2017   ALKPHOS 62 01/01/2017   AST 23 01/01/2017   ALT 26 01/01/2017   PROT 7.5 01/01/2017   ALBUMIN 4.5 01/01/2017   CALCIUM 9.5 01/01/2017   GFR 134.02 01/01/2017   Lab Results  Component Value Date   CHOL 247 (H) 09/25/2016   Lab Results  Component Value Date   HDL 67 09/25/2016   Lab Results  Component Value Date   LDLCALC 161 (H) 09/25/2016   Lab Results  Component Value Date   TRIG 95 09/25/2016   Lab Results  Component Value Date   CHOLHDL 3.7 09/25/2016   No results found for: HGBA1C       Assessment & Plan:   Problem List Items Addressed This Visit      Unprioritized   ADD (attention deficit disorder)    Stable Refill meds      Relevant Medications   amphetamine-dextroamphetamine (ADDERALL XR) 30 MG 24 hr capsule   amphetamine-dextroamphetamine (ADDERALL XR) 30 MG 24 hr capsule   amphetamine-dextroamphetamine (ADDERALL XR) 30 MG 24 hr capsule   DEPRESSION/ANXIETY    Wean off trinlellix  Then restart zoloft 50 mg daily for 1 week then 100 mg daily--- will get her up to 200 mg again and slowing inc wellbutrin to 450 if needed D/w pt that we may need to get her in to see psych if no improvement       Relevant  Medications   sertraline (ZOLOFT) 100 MG tablet   buPROPion (WELLBUTRIN XL) 150 MG 24 hr tablet   Low back pain radiating to both legs    Refill pain meds Pt needs to reschedule [facility] ov -- cancelled due to snow       Relevant Medications   HYDROmorphone (DILAUDID) 4 MG tablet    Other Visit Diagnoses    Anxiety and depression    -  Primary   Relevant Medications   sertraline (ZOLOFT) 100 MG tablet   buPROPion (WELLBUTRIN XL) 150 MG 24 hr tablet   Generalized anxiety disorder       Relevant Medications   sertraline (ZOLOFT) 100 MG tablet   buPROPion (WELLBUTRIN XL) 150 MG 24 hr tablet   clonazePAM (KLONOPIN) 1 MG tablet   Acne vulgaris       Relevant Medications   clindamycin (CLEOCIN T) 1 % lotion   Acute right-sided low back pain with right-sided sciatica       Relevant Medications   HYDROmorphone (DILAUDID) 4 MG tablet      I have discontinued Terry Lesniak's vortioxetine HBr and vortioxetine HBr. I am also having her start on sertraline and buPROPion. Additionally, I am having her maintain her pantoprazole, HYDROmorphone, HYDROmorphone, clindamycin, clonazePAM, HYDROmorphone, amphetamine-dextroamphetamine, amphetamine-dextroamphetamine, and amphetamine-dextroamphetamine.  Meds ordered this encounter  Medications  . DISCONTD: vortioxetine HBr (TRINTELLIX) 10 MG TABS    Sig: Take 1 tablet (10 mg total) by mouth daily.    Dispense:  30 tablet    Refill:  0  . sertraline (ZOLOFT) 100 MG tablet    Sig: 2 po qd as directed    Dispense:  60 tablet    Refill:  5  . buPROPion (WELLBUTRIN XL) 150 MG 24 hr tablet    Sig: 1 po qd x 1 week than increase  to 2 a day    Dispense:  60 tablet    Refill:  2  . clindamycin (CLEOCIN T) 1 % lotion    Sig: APPLY TO AFFECTED AREA TWICE A DAY    Dispense:  60 mL    Refill:  1  . DISCONTD: amphetamine-dextroamphetamine (ADDERALL XR) 30 MG 24 hr capsule    Sig: Take 2 capsules (60 mg total) by mouth every morning. Take 2qam;     Dispense:  60 capsule    Refill:  0    Do not fill until April 2019  . DISCONTD: amphetamine-dextroamphetamine (ADDERALL XR) 30 MG 24 hr capsule    Sig: Take 2 capsules (60 mg total) by mouth every morning. Take 2qam    Dispense:  60 capsule    Refill:  0    Do not fill until March 2019  . DISCONTD: amphetamine-dextroamphetamine (ADDERALL XR) 30 MG 24 hr capsule    Sig: Take 2 capsules (60 mg total) by mouth every morning. Take 2qam;    Dispense:  60 capsule    Refill:  0  . clonazePAM (KLONOPIN) 1 MG tablet    Sig: Take 1 tablet (1 mg total) by mouth 2 (two) times daily as needed.    Dispense:  60 tablet    Refill:  1  . HYDROmorphone (DILAUDID) 4 MG tablet    Sig: Take 1 tablet (4 mg total) by mouth every 6 (six) hours as needed for severe pain.    Dispense:  60 tablet    Refill:  0  . amphetamine-dextroamphetamine (ADDERALL XR) 30 MG 24 hr capsule    Sig: Take 2 capsules (60 mg total) by mouth every morning. Take 2qam;    Dispense:  60 capsule    Refill:  0    Do not fill until April 2019  . amphetamine-dextroamphetamine (ADDERALL XR) 30 MG 24 hr capsule    Sig: Take 2 capsules (60 mg total) by mouth every morning. Take 2qam    Dispense:  60 capsule    Refill:  0    Do not fill until March 2019  . amphetamine-dextroamphetamine (ADDERALL XR) 30 MG 24 hr capsule    Sig: Take 2 capsules (60 mg total) by mouth every morning. Take 2qam;    Dispense:  60 capsule    Refill:  0    CMA served as scribe during this visit. History, Physical and Plan performed by medical provider. Documentation and orders reviewed and attested to.  Donato Schultz, DO

## 2017-10-22 NOTE — Assessment & Plan Note (Signed)
Stable. Refill meds

## 2017-11-04 ENCOUNTER — Other Ambulatory Visit: Payer: Self-pay | Admitting: Family Medicine

## 2017-11-04 DIAGNOSIS — F411 Generalized anxiety disorder: Secondary | ICD-10-CM

## 2017-11-05 ENCOUNTER — Encounter: Payer: Self-pay | Admitting: Family Medicine

## 2017-12-20 ENCOUNTER — Encounter: Payer: Self-pay | Admitting: Family Medicine

## 2017-12-20 ENCOUNTER — Other Ambulatory Visit: Payer: Self-pay | Admitting: Family Medicine

## 2017-12-20 DIAGNOSIS — M5441 Lumbago with sciatica, right side: Secondary | ICD-10-CM

## 2017-12-20 NOTE — Telephone Encounter (Signed)
I need database

## 2017-12-20 NOTE — Telephone Encounter (Signed)
Pt requesting refill on Dilaudid.

## 2017-12-21 ENCOUNTER — Other Ambulatory Visit: Payer: Self-pay | Admitting: Family Medicine

## 2017-12-21 DIAGNOSIS — M5441 Lumbago with sciatica, right side: Secondary | ICD-10-CM

## 2017-12-21 MED ORDER — HYDROMORPHONE HCL 4 MG PO TABS: 4.0000 mg | ORAL_TABLET | Freq: Four times a day (QID) | ORAL | 0 refills | Status: DC | PRN

## 2017-12-21 NOTE — Telephone Encounter (Signed)
I will refill med but has she rescheduled her [facility] appointment ?

## 2017-12-21 NOTE — Telephone Encounter (Signed)
Patient requesting pain medication dilaudid  Database ran and is on your desk for review.  Last filled per database: 10/22/17 Last written: 10/22/17 Last ov: 10/22/17 Next ov: none Contract: 01/13/18 UDS: 12/23/17

## 2018-01-18 ENCOUNTER — Other Ambulatory Visit: Payer: Self-pay | Admitting: Family Medicine

## 2018-01-18 DIAGNOSIS — F411 Generalized anxiety disorder: Secondary | ICD-10-CM

## 2018-01-18 NOTE — Telephone Encounter (Signed)
[facility] [address] [place] requesting refill for clonazepam  Database ran 12/23/17 and is media for review  Last written: 10/22/17 Last ov: 10/22/17 Next ov: none Contract: due UDS: due

## 2018-02-17 ENCOUNTER — Other Ambulatory Visit: Payer: Self-pay | Admitting: Family Medicine

## 2018-02-17 DIAGNOSIS — F411 Generalized anxiety disorder: Secondary | ICD-10-CM

## 2018-02-18 NOTE — Telephone Encounter (Signed)
Database ran on 12/23/17 and is in media for review.  Last written: 01/19/18 Last ov: 10/22/17 Next ov:  none Contract: 06/24/18 UDS: due

## 2018-03-22 ENCOUNTER — Other Ambulatory Visit: Payer: Self-pay | Admitting: Family Medicine

## 2018-03-22 DIAGNOSIS — F329 Major depressive disorder, single episode, unspecified: Secondary | ICD-10-CM

## 2018-03-22 DIAGNOSIS — F419 Anxiety disorder, unspecified: Secondary | ICD-10-CM

## 2018-03-22 DIAGNOSIS — F32A Depression, unspecified: Secondary | ICD-10-CM

## 2018-03-22 DIAGNOSIS — F411 Generalized anxiety disorder: Secondary | ICD-10-CM

## 2018-03-22 NOTE — Telephone Encounter (Signed)
Requesting: KLONOPIN 1 MG Contract: 06/24/17  UDS:06/24/17 Low risk Last OV: 10/22/17 Next OV:- Last Refill:02/18/18   Please advise

## 2018-04-21 ENCOUNTER — Other Ambulatory Visit: Payer: Self-pay | Admitting: Family Medicine

## 2018-04-21 DIAGNOSIS — F329 Major depressive disorder, single episode, unspecified: Secondary | ICD-10-CM

## 2018-04-21 DIAGNOSIS — F419 Anxiety disorder, unspecified: Principal | ICD-10-CM

## 2018-04-26 ENCOUNTER — Telehealth: Payer: Managed Care, Other (non HMO) | Admitting: Physician Assistant

## 2018-04-26 DIAGNOSIS — M549 Dorsalgia, unspecified: Secondary | ICD-10-CM

## 2018-04-26 MED ORDER — BACLOFEN 10 MG PO TABS: 10.0000 mg | ORAL_TABLET | Freq: Three times a day (TID) | ORAL | 0 refills | Status: DC

## 2018-04-26 MED ORDER — NAPROXEN 500 MG PO TABS: 500.0000 mg | ORAL_TABLET | Freq: Two times a day (BID) | ORAL | 0 refills | Status: DC

## 2018-04-26 NOTE — Progress Notes (Signed)

## 2018-05-18 ENCOUNTER — Other Ambulatory Visit: Payer: Self-pay | Admitting: Physician Assistant

## 2018-05-18 ENCOUNTER — Other Ambulatory Visit: Payer: Self-pay | Admitting: Family Medicine

## 2018-05-18 DIAGNOSIS — F329 Major depressive disorder, single episode, unspecified: Secondary | ICD-10-CM

## 2018-05-18 DIAGNOSIS — F419 Anxiety disorder, unspecified: Principal | ICD-10-CM

## 2018-05-18 DIAGNOSIS — F32A Depression, unspecified: Secondary | ICD-10-CM

## 2018-05-18 DIAGNOSIS — F411 Generalized anxiety disorder: Secondary | ICD-10-CM

## 2018-05-19 ENCOUNTER — Other Ambulatory Visit: Payer: Self-pay | Admitting: Physician Assistant

## 2018-05-19 ENCOUNTER — Other Ambulatory Visit: Payer: Self-pay | Admitting: Family Medicine

## 2018-05-19 DIAGNOSIS — F411 Generalized anxiety disorder: Secondary | ICD-10-CM

## 2018-05-19 MED ORDER — CLONAZEPAM 1 MG PO TABS: 1.0000 mg | ORAL_TABLET | Freq: Two times a day (BID) | ORAL | 0 refills | Status: DC | PRN

## 2018-05-19 NOTE — Telephone Encounter (Signed)
Requesting:Clonazepam Contract:none, needs csc UDS:06/24/17 low risk Last Visit:10/22/17 Next Visit:none Last Refill:03/23/18   Database ran and is on desk for review  Please Advise

## 2018-05-26 NOTE — Telephone Encounter (Signed)
error 

## 2018-05-27 ENCOUNTER — Other Ambulatory Visit: Payer: Self-pay | Admitting: Physician Assistant

## 2018-05-30 ENCOUNTER — Other Ambulatory Visit: Payer: Self-pay | Admitting: Physician Assistant

## 2018-05-30 ENCOUNTER — Other Ambulatory Visit: Payer: Self-pay | Admitting: Family Medicine

## 2018-05-30 DIAGNOSIS — F419 Anxiety disorder, unspecified: Principal | ICD-10-CM

## 2018-05-30 DIAGNOSIS — F329 Major depressive disorder, single episode, unspecified: Secondary | ICD-10-CM

## 2018-05-30 DIAGNOSIS — F32A Depression, unspecified: Secondary | ICD-10-CM

## 2018-05-30 NOTE — Telephone Encounter (Signed)
Please advise patient that she should further discuss her back pain (which is chronic) and ongoing treatments with her PCP, Dr Laury Axon. At this point I will deny her medications. Thanks.

## 2018-06-02 ENCOUNTER — Encounter: Payer: Self-pay | Admitting: Family Medicine

## 2018-06-09 ENCOUNTER — Other Ambulatory Visit: Payer: Self-pay | Admitting: Family Medicine

## 2018-06-09 DIAGNOSIS — M5441 Lumbago with sciatica, right side: Secondary | ICD-10-CM

## 2018-06-09 MED ORDER — NAPROXEN 500 MG PO TABS: 500.0000 mg | ORAL_TABLET | Freq: Two times a day (BID) | ORAL | 0 refills | Status: DC

## 2018-06-09 MED ORDER — BACLOFEN 10 MG PO TABS: 10.0000 mg | ORAL_TABLET | Freq: Three times a day (TID) | ORAL | 0 refills | Status: DC

## 2018-06-09 NOTE — Telephone Encounter (Signed)
Sent in

## 2018-06-20 ENCOUNTER — Other Ambulatory Visit: Payer: Self-pay | Admitting: Family Medicine

## 2018-06-20 DIAGNOSIS — L700 Acne vulgaris: Secondary | ICD-10-CM

## 2018-06-27 ENCOUNTER — Ambulatory Visit (INDEPENDENT_AMBULATORY_CARE_PROVIDER_SITE_OTHER): Payer: Managed Care, Other (non HMO) | Admitting: Family Medicine

## 2018-06-27 ENCOUNTER — Encounter: Payer: Self-pay | Admitting: Family Medicine

## 2018-06-27 VITALS — BP 123/65 | HR 79 | Temp 98.9°F | Resp 16 | Ht 62.0 in | Wt 235.6 lb

## 2018-06-27 DIAGNOSIS — F411 Generalized anxiety disorder: Secondary | ICD-10-CM | POA: Diagnosis not present

## 2018-06-27 DIAGNOSIS — F419 Anxiety disorder, unspecified: Secondary | ICD-10-CM | POA: Diagnosis not present

## 2018-06-27 DIAGNOSIS — Z79899 Other long term (current) drug therapy: Secondary | ICD-10-CM

## 2018-06-27 DIAGNOSIS — F329 Major depressive disorder, single episode, unspecified: Secondary | ICD-10-CM

## 2018-06-27 DIAGNOSIS — F32A Depression, unspecified: Secondary | ICD-10-CM

## 2018-06-27 DIAGNOSIS — R1013 Epigastric pain: Secondary | ICD-10-CM

## 2018-06-27 MED ORDER — CLONAZEPAM 1 MG PO TABS: 1.0000 mg | ORAL_TABLET | Freq: Two times a day (BID) | ORAL | 2 refills | Status: DC | PRN

## 2018-06-27 MED ORDER — BUPROPION HCL ER (XL) 150 MG PO TB24: ORAL_TABLET | ORAL | 3 refills | Status: DC

## 2018-06-27 MED ORDER — SERTRALINE HCL 100 MG PO TABS: ORAL_TABLET | ORAL | 3 refills | Status: DC

## 2018-06-27 MED ORDER — PANTOPRAZOLE SODIUM 40 MG PO TBEC: 40.0000 mg | DELAYED_RELEASE_TABLET | Freq: Every day | ORAL | 3 refills | Status: DC

## 2018-06-27 NOTE — Progress Notes (Signed)
Patient ID: Veronica Yates, female    DOB: [birth year]  Age: 47 y.o. MRN: [id number]    Subjective:  Subjective  HPI Veronica Yates presents for f/u depression/ anxiety and gerd.  She ran out of protonix and gerd has come back  Review of Systems  Constitutional: Negative for chills and fever.  HENT: Negative for congestion and hearing loss.   Eyes: Negative for discharge.  Respiratory: Negative for cough and shortness of breath.   Cardiovascular: Negative for chest pain, palpitations and leg swelling.  Gastrointestinal: Negative for abdominal pain, blood in stool, constipation, diarrhea, nausea and vomiting.  Genitourinary: Negative for dysuria, frequency, hematuria and urgency.  Musculoskeletal: Negative for back pain and myalgias.  Skin: Negative for rash.  Allergic/Immunologic: Negative for environmental allergies.  Neurological: Negative for dizziness, weakness and headaches.  Hematological: Does not bruise/bleed easily.  Psychiatric/Behavioral: Negative for suicidal ideas. The patient is not nervous/anxious.     History Past Medical History:  Diagnosis Date  . Depression   . GERD (gastroesophageal reflux disease)     She has a past surgical history that includes dnc; tubal reversal; Tonsillectomy; Cesarean section; and laparascopy.   Her family history includes Bipolar disorder in her sister; Depression in her unknown relative; Diabetes in her father and mother.She reports that she has never smoked. She has never used smokeless tobacco. She reports that she does not drink alcohol or use drugs.  Current Outpatient Medications on File Prior to Visit  Medication Sig Dispense Refill  . baclofen (LIORESAL) 10 MG tablet Take 1 tablet (10 mg total) by mouth 3 (three) times daily. 45 each 0  . clindamycin (CLEOCIN T) 1 % lotion APPLY EXTERNALLY TO AFFECTED AREA(S) TWO TIMES A DAY 60 mL 0  . naproxen (NAPROSYN) 500 MG tablet Take 1 tablet (500 mg total) by mouth 2 (two) times  daily with a meal. 30 tablet 0   No current facility-administered medications on file prior to visit.      Objective:  Objective  Physical Exam  Constitutional: She is oriented to person, place, and time. She appears well-developed and well-nourished.  HENT:  Head: Normocephalic and atraumatic.  Eyes: Conjunctivae and EOM are normal.  Neck: Normal range of motion. Neck supple. No JVD present. Carotid bruit is not present. No thyromegaly present.  Cardiovascular: Normal rate, regular rhythm and normal heart sounds.  No murmur heard. Pulmonary/Chest: Effort normal and breath sounds normal. No respiratory distress. She has no wheezes. She has no rales. She exhibits no tenderness.  Musculoskeletal: She exhibits no edema.  Neurological: She is alert and oriented to person, place, and time.  Psychiatric: She has a normal mood and affect.  Nursing note and vitals reviewed.  BP 123/65 (BP Location: Right Arm, Cuff Size: Large)   Pulse 79   Temp 98.9 F (37.2 C) (Oral)   Resp 16   Ht 5\' 2"  (1.575 m)   Wt 235 lb 9.6 oz (106.9 kg)   LMP 06/20/2018   SpO2 100%   BMI 43.09 kg/m  Wt Readings from Last 3 Encounters:  06/27/18 235 lb 9.6 oz (106.9 kg)  10/22/17 221 lb 12.8 oz (100.6 kg)  06/24/17 216 lb (98 kg)     Lab Results  Component Value Date   WBC 6.3 01/01/2017   HGB 12.8 01/01/2017   HCT 37.7 01/01/2017   PLT 328.0 01/01/2017   GLUCOSE 92 01/01/2017   CHOL 247 (H) 09/25/2016   TRIG 95 09/25/2016   HDL 67 09/25/2016  LDLCALC 161 (H) 09/25/2016   ALT 26 01/01/2017   AST 23 01/01/2017   NA 135 01/01/2017   K 3.9 01/01/2017   CL 103 01/01/2017   CREATININE 0.52 01/01/2017   BUN 19 01/01/2017   CO2 25 01/01/2017   TSH 1.30 01/01/2017    No results found.   Assessment & Plan:  Plan  I have discontinued Veronica Yates's HYDROmorphone, HYDROmorphone, amphetamine-dextroamphetamine, amphetamine-dextroamphetamine, amphetamine-dextroamphetamine, and HYDROmorphone. I  have also changed her buPROPion. Additionally, I am having her maintain her baclofen, naproxen, clindamycin, clonazePAM, pantoprazole, and sertraline.  Meds ordered this encounter  Medications  . clonazePAM (KLONOPIN) 1 MG tablet    Sig: Take 1 tablet (1 mg total) by mouth 2 (two) times daily as needed.    Dispense:  60 tablet    Refill:  2  . buPROPion (WELLBUTRIN XL) 150 MG 24 hr tablet    Sig: TAKE 2 TABLETS(300 MG) BY MOUTH DAILY.    Dispense:  180 tablet    Refill:  3  . pantoprazole (PROTONIX) 40 MG tablet    Sig: Take 1 tablet (40 mg total) by mouth daily.    Dispense:  90 tablet    Refill:  3  . sertraline (ZOLOFT) 100 MG tablet    Sig: TAKE 2 TABLETS BY MOUTH EVERY DAY AS DIRECTED    Dispense:  180 tablet    Refill:  3    Problem List Items Addressed This Visit      Unprioritized   Dyspepsia   Relevant Medications   pantoprazole (PROTONIX) 40 MG tablet    Other Visit Diagnoses    High risk medication use    -  Primary   Relevant Orders   Pain Mgmt, Profile 8 w/Conf, U   Generalized anxiety disorder       Relevant Medications   clonazePAM (KLONOPIN) 1 MG tablet   buPROPion (WELLBUTRIN XL) 150 MG 24 hr tablet   sertraline (ZOLOFT) 100 MG tablet   Anxiety and depression       Relevant Medications   buPROPion (WELLBUTRIN XL) 150 MG 24 hr tablet   sertraline (ZOLOFT) 100 MG tablet   Other Relevant Orders   Pain Mgmt, Profile 8 w/Conf, U    anxiety controlled / stable on current meds  Follow-up: Return in about 6 months (around 12/27/2018), or if symptoms worsen or fail to improve.  Donato Schultz, DO

## 2018-06-27 NOTE — Patient Instructions (Signed)

## 2018-06-30 LAB — PAIN MGMT, PROFILE 8 W/CONF, U
6 Acetylmorphine: NEGATIVE ng/mL (ref <10)
ALCOHOL METABOLITES: NEGATIVE ng/mL (ref <500)
AMINOCLONAZEPAM: 113 ng/mL — AB (ref <25)
Alphahydroxyalprazolam: NEGATIVE ng/mL (ref <25)
Alphahydroxymidazolam: NEGATIVE ng/mL (ref <50)
Alphahydroxytriazolam: NEGATIVE ng/mL (ref <50)
Amphetamines: NEGATIVE ng/mL (ref <500)
Benzodiazepines: POSITIVE ng/mL — AB (ref <100)
Buprenorphine, Urine: NEGATIVE ng/mL (ref <5)
COCAINE METABOLITE: NEGATIVE ng/mL (ref <150)
CREATININE: 41.8 mg/dL
Hydroxyethylflurazepam: NEGATIVE ng/mL (ref <50)
LORAZEPAM: NEGATIVE ng/mL (ref <50)
MDMA: NEGATIVE ng/mL (ref <500)
Marijuana Metabolite: NEGATIVE ng/mL (ref <20)
NORDIAZEPAM: NEGATIVE ng/mL (ref <50)
OPIATES: NEGATIVE ng/mL (ref <100)
Oxazepam: NEGATIVE ng/mL (ref <50)
Oxidant: NEGATIVE ug/mL (ref <200)
Oxycodone: NEGATIVE ng/mL (ref <100)
PH: 6.87 (ref 4.5–9.0)
Temazepam: NEGATIVE ng/mL (ref <50)

## 2018-07-26 ENCOUNTER — Other Ambulatory Visit: Payer: Self-pay | Admitting: Family Medicine

## 2018-07-26 DIAGNOSIS — L700 Acne vulgaris: Secondary | ICD-10-CM

## 2018-07-26 DIAGNOSIS — M5441 Lumbago with sciatica, right side: Secondary | ICD-10-CM

## 2018-07-27 NOTE — Telephone Encounter (Signed)
Pt requesting refill on naproxen, baclofen, and clindamycin 1% lotion.   Last Fill on baclofen: 06/09/2018 #45 and 0RF Last Fill on naproxen: 06/09/2018 #30 and 0RF Last Fill on lotion: 06/21/2018 #9mL and 0RF  Okay to refill?

## 2018-10-01 ENCOUNTER — Other Ambulatory Visit: Payer: Self-pay | Admitting: Family Medicine

## 2018-10-01 DIAGNOSIS — F411 Generalized anxiety disorder: Secondary | ICD-10-CM

## 2018-10-04 NOTE — Telephone Encounter (Signed)
Database ran and is on your desk for review.  Last filled per database: 06/27/18 Last written: 06/27/18 Last ov: 06/27/18 Next ov: none Contract: 07/08/19  UDS: 07/08/19

## 2018-10-10 ENCOUNTER — Other Ambulatory Visit: Payer: Self-pay | Admitting: Family Medicine

## 2018-10-10 DIAGNOSIS — L700 Acne vulgaris: Secondary | ICD-10-CM

## 2018-12-22 ENCOUNTER — Other Ambulatory Visit: Payer: Self-pay | Admitting: Family Medicine

## 2018-12-22 DIAGNOSIS — F411 Generalized anxiety disorder: Secondary | ICD-10-CM

## 2018-12-22 NOTE — Telephone Encounter (Signed)
Last written: 10/04/18 Last ov: 06/27/18 Next ov: none Contract: 06/28/19 UDS: 06/28/19

## 2019-01-04 ENCOUNTER — Encounter: Payer: Self-pay | Admitting: Family Medicine

## 2019-01-04 NOTE — Telephone Encounter (Signed)
Attempted to reach pt via phone and received voicemail. Did not leave message. Sent response via mychart.

## 2019-01-06 ENCOUNTER — Other Ambulatory Visit: Payer: Self-pay

## 2019-01-06 ENCOUNTER — Encounter: Payer: Self-pay | Admitting: Family Medicine

## 2019-01-06 ENCOUNTER — Ambulatory Visit (INDEPENDENT_AMBULATORY_CARE_PROVIDER_SITE_OTHER): Payer: 59 | Admitting: Family Medicine

## 2019-01-06 DIAGNOSIS — F411 Generalized anxiety disorder: Secondary | ICD-10-CM

## 2019-01-06 DIAGNOSIS — M5441 Lumbago with sciatica, right side: Secondary | ICD-10-CM | POA: Diagnosis not present

## 2019-01-06 MED ORDER — NAPROXEN 500 MG PO TABS: ORAL_TABLET | ORAL | 2 refills | Status: DC

## 2019-01-06 MED ORDER — CLONAZEPAM 1 MG PO TABS: 1.0000 mg | ORAL_TABLET | Freq: Two times a day (BID) | ORAL | 1 refills | Status: DC | PRN

## 2019-01-06 MED ORDER — BACLOFEN 10 MG PO TABS: 10.0000 mg | ORAL_TABLET | Freq: Three times a day (TID) | ORAL | 0 refills | Status: DC

## 2019-01-06 NOTE — Progress Notes (Signed)
Virtual Visit via Video Note  I connected with Veronica Yates on 01/06/19 at  3:00 PM EDT by a video enabled telemedicine application and verified that I am speaking with the correct person using two identifiers.   I discussed the limitations of evaluation and management by telemedicine and the availability of in person appointments. The patient expressed understanding and agreed to proceed.  History of Present Illness: Pt is home -- and needs refills on clonopin, baclofen and naproxyen.  She is doing well   No complaints.   She is getting ready to move to [place] and be closer to family.      Provider-- at office  Observations/Objective: No vitals were able to be obtained Pt is in NAD rr normal   Assessment and Plan: 1. Generalized anxiety disorder Stable  con't meds  - clonazePAM (KLONOPIN) 1 MG tablet; Take 1 tablet (1 mg total) by mouth 2 (two) times daily as needed.  Dispense: 60 tablet; Refill: 1  2. Acute right-sided low back pain with right-sided sciatica Stable--- will f/u with new drs in [place]  - naproxen (NAPROSYN) 500 MG tablet; TAKE ONE TABLET BY MOUTH TWICE A DAY WITH MEAL(S)  Dispense: 30 tablet; Refill: 2 - baclofen (LIORESAL) 10 MG tablet; Take 1 tablet (10 mg total) by mouth 3 (three) times daily.  Dispense: 45 tablet; Refill: 0   Follow Up Instructions:    I discussed the assessment and treatment plan with the patient. The patient was provided an opportunity to ask questions and all were answered. The patient agreed with the plan and demonstrated an understanding of the instructions.   The patient was advised to call back or seek an in-person evaluation if the symptoms worsen or if the condition fails to improve as anticipated.  I provided 15 minutes of non-face-to-face time during this encounter.   Donato Schultz, DO

## 2019-03-02 ENCOUNTER — Other Ambulatory Visit: Payer: Self-pay | Admitting: Family Medicine

## 2019-03-02 DIAGNOSIS — M5441 Lumbago with sciatica, right side: Secondary | ICD-10-CM

## 2019-06-14 MED ORDER — PANOMAT V-5 MISC
5.00 | Status: DC
Start: ? — End: 2019-06-14

## 2019-07-06 ENCOUNTER — Other Ambulatory Visit: Payer: Self-pay

## 2019-07-06 ENCOUNTER — Ambulatory Visit (INDEPENDENT_AMBULATORY_CARE_PROVIDER_SITE_OTHER): Payer: 59 | Admitting: Family Medicine

## 2019-07-06 ENCOUNTER — Encounter: Payer: Self-pay | Admitting: Family Medicine

## 2019-07-06 DIAGNOSIS — F411 Generalized anxiety disorder: Secondary | ICD-10-CM | POA: Diagnosis not present

## 2019-07-06 DIAGNOSIS — F419 Anxiety disorder, unspecified: Secondary | ICD-10-CM | POA: Diagnosis not present

## 2019-07-06 DIAGNOSIS — F329 Major depressive disorder, single episode, unspecified: Secondary | ICD-10-CM

## 2019-07-06 DIAGNOSIS — L700 Acne vulgaris: Secondary | ICD-10-CM | POA: Diagnosis not present

## 2019-07-06 DIAGNOSIS — J189 Pneumonia, unspecified organism: Secondary | ICD-10-CM

## 2019-07-06 MED ORDER — CLONAZEPAM 1 MG PO TABS: 1.0000 mg | ORAL_TABLET | Freq: Two times a day (BID) | ORAL | 1 refills | Status: DC | PRN

## 2019-07-06 MED ORDER — SERTRALINE HCL 100 MG PO TABS: ORAL_TABLET | ORAL | 3 refills | Status: DC

## 2019-07-06 MED ORDER — BUPROPION HCL ER (XL) 150 MG PO TB24: ORAL_TABLET | ORAL | 3 refills | Status: DC

## 2019-07-06 MED ORDER — CLINDAMYCIN PHOSPHATE 1 % EX LOTN: TOPICAL_LOTION | CUTANEOUS | 2 refills | Status: DC

## 2019-07-06 NOTE — Progress Notes (Signed)
Virtual Visit via Video Note  I connected with Veronica Yates on 07/06/19 at  9:40 AM EDT by a video enabled telemedicine application and verified that I am speaking with the correct person using two identifiers.  Location: Patient: home  Provider: office    I discussed the limitations of evaluation and management by telemedicine and the availability of in person appointments. The patient expressed understanding and agreed to proceed.  History of Present Illness: Pt is home needing refills     Her anxiety is stable She also c/o being seen in [facility] for pneumonia last month at [facility].  cxr reviewed in care everywhere--- + RLL opacity Pt is feeling much better but is concerned because the er dr was not convinced it was pneumonia---- covid neg She is also asking for help with losing weight   She is struggling and is requesting meds   Observations/Objective: No vitals obtained Pt is in NAD Assessment and Plan: 1. Acne vulgaris Refill cleocin  - clindamycin (CLEOCIN T) 1 % lotion; Apply bid  Dispense: 60 mL; Refill: 2  2. Generalized anxiety disorder Stable Refill meds  - clonazePAM (KLONOPIN) 1 MG tablet; Take 1 tablet (1 mg total) by mouth 2 (two) times daily as needed.  Dispense: 60 tablet; Refill: 1  3. Anxiety and depression See above  - buPROPion (WELLBUTRIN XL) 150 MG 24 hr tablet; TAKE 2 TABLETS(300 MG) BY MOUTH DAILY.  Dispense: 180 tablet; Refill: 3 - sertraline (ZOLOFT) 100 MG tablet; TAKE 2 TABLETS BY MOUTH EVERY DAY AS DIRECTED  Dispense: 180 tablet; Refill: 3  4. Morbid obesity ([place]) Pt will check with her ins about saxenda but we did refer her to healthy weight and wellness  - Amb Ref to Medical Weight Management  5. Pneumonia of right lower lobe due to infectious organism Recheck cxr Pt is feeling much better - DG Chest 2 View; Future  Follow Up Instructions:    I discussed the assessment and treatment plan with the patient. The patient was provided an  opportunity to ask questions and all were answered. The patient agreed with the plan and demonstrated an understanding of the instructions.   The patient was advised to call back or seek an in-person evaluation if the symptoms worsen or if the condition fails to improve as anticipated.  I provided 15 minutes of non-face-to-face time during this encounter.   Ann Held, DO

## 2019-08-29 ENCOUNTER — Telehealth: Payer: 59 | Admitting: Physician Assistant

## 2019-08-29 DIAGNOSIS — R29898 Other symptoms and signs involving the musculoskeletal system: Secondary | ICD-10-CM

## 2019-08-29 DIAGNOSIS — M545 Low back pain, unspecified: Secondary | ICD-10-CM

## 2019-08-29 NOTE — Progress Notes (Signed)
Based on what you shared with me, I feel your condition warrants further evaluation and I recommend that you be seen for a face to face office visit.  I am concerned about the numbness and weakness of your legs which accompanies your severe lower back pain.  E-visits are meant to help with minor problems, and I am concerned about something more serious.  I would feel more comfortable if you saw a provider face to face for a physical exam and possibly imaging.     NOTE: If you entered your credit card information for this eVisit, you will not be charged. You may see a "hold" on your card for the $35 but that hold will drop off and you will not have a charge processed.   If you are having a true medical emergency please call 911.      For an urgent face to face visit, [place] has five urgent care centers for your convenience:      NEW:  [facility] [place] [facility] [place] Get Driving Directions [phone] [address] [place], [place] [address] . 10 am - 6pm Monday - Friday    [place] [facility]) Get Driving Directions [phone] [address] [place], [place] [address] . 10 am to 8 pm Monday-Friday . 12 pm to 8 pm [facility] at [facility] Get Driving Directions [phone] [address] [place], [place] [address] . 8 am to 8 pm Monday-Friday . 9 am to 6 pm Saturday . 11 am to 6 pm Sunday     [facility] at [facility] Get Driving Directions  [phone] [address] [place], [place] [address] . 8 am to 8 pm Monday-Friday . 8 am to 4 pm [facility] at [place] Get Driving Directions [phone] [address] [place],  [address] . 12 pm to 6 pm Monday-Friday      Your e-visit answers were reviewed by a board certified advanced clinical practitioner to complete your personal care  plan.  Thank you for using e-Visits.

## 2019-08-30 ENCOUNTER — Encounter: Payer: Self-pay | Admitting: Family Medicine

## 2019-08-31 NOTE — Telephone Encounter (Signed)
We can do neurontin (gabepentin) 100 mg tid --- start with 1 at night for a few nights then bid for a few nights then tid------  #90 If this does not help she will need to be seen in an UC where she is  The dose can be increased from there

## 2019-09-07 ENCOUNTER — Other Ambulatory Visit: Payer: Self-pay | Admitting: Family Medicine

## 2019-09-07 DIAGNOSIS — F411 Generalized anxiety disorder: Secondary | ICD-10-CM

## 2019-09-08 NOTE — Telephone Encounter (Signed)
Last OV 08/29/19 Last refill 07/06/19 #60/1 Next OV not scheduled

## 2019-09-12 ENCOUNTER — Encounter: Payer: Self-pay | Admitting: Family Medicine

## 2019-09-13 MED ORDER — GABAPENTIN 100 MG PO CAPS: 100.0000 mg | ORAL_CAPSULE | Freq: Three times a day (TID) | ORAL | 0 refills | Status: DC

## 2019-09-26 ENCOUNTER — Ambulatory Visit: Payer: 59 | Admitting: Family Medicine

## 2019-10-10 ENCOUNTER — Other Ambulatory Visit: Payer: Self-pay | Admitting: Family Medicine

## 2019-10-10 DIAGNOSIS — F411 Generalized anxiety disorder: Secondary | ICD-10-CM

## 2019-10-11 NOTE — Telephone Encounter (Signed)
Requesting: Klonopin Contract: 06/27/2018 UDS: 06/27/2018. Low risk Last OV: 07/06/2019 Next OV: N/A Last Refill: 09/08/2019, #60--0 RF Database:   Please advise

## 2019-12-04 ENCOUNTER — Other Ambulatory Visit: Payer: Self-pay

## 2019-12-05 ENCOUNTER — Other Ambulatory Visit: Payer: Self-pay

## 2019-12-05 ENCOUNTER — Ambulatory Visit (INDEPENDENT_AMBULATORY_CARE_PROVIDER_SITE_OTHER): Payer: 59 | Admitting: Family Medicine

## 2019-12-05 ENCOUNTER — Encounter: Payer: Self-pay | Admitting: Family Medicine

## 2019-12-05 VITALS — BP 118/80 | HR 88 | Temp 97.9°F | Resp 18 | Ht 62.0 in | Wt 234.4 lb

## 2019-12-05 DIAGNOSIS — F419 Anxiety disorder, unspecified: Secondary | ICD-10-CM | POA: Diagnosis not present

## 2019-12-05 DIAGNOSIS — F411 Generalized anxiety disorder: Secondary | ICD-10-CM

## 2019-12-05 DIAGNOSIS — Z79899 Other long term (current) drug therapy: Secondary | ICD-10-CM | POA: Diagnosis not present

## 2019-12-05 DIAGNOSIS — F329 Major depressive disorder, single episode, unspecified: Secondary | ICD-10-CM

## 2019-12-05 DIAGNOSIS — F32A Depression, unspecified: Secondary | ICD-10-CM

## 2019-12-05 DIAGNOSIS — F341 Dysthymic disorder: Secondary | ICD-10-CM | POA: Diagnosis not present

## 2019-12-05 MED ORDER — BUPROPION HCL ER (XL) 150 MG PO TB24: ORAL_TABLET | ORAL | 3 refills | Status: DC

## 2019-12-05 MED ORDER — CLONAZEPAM 1 MG PO TABS: 1.0000 mg | ORAL_TABLET | Freq: Two times a day (BID) | ORAL | 0 refills | Status: DC | PRN

## 2019-12-05 NOTE — Progress Notes (Signed)
Patient ID: Veronica Yates, female    DOB: Sep 12, 1971  Age: 49 y.o. MRN: [id number]    Subjective:  Subjective  HPI Veronica Yates presents for f/u -- she has moved to [place] ,  [place]    So this is her last visit here.  She is interested in the gastric sleeve procedure   She has already had a consultation with a surgeon in [place]    Pt had covid in Nov and still can not taste or smell---  She has some brain fog/ insomnia.    Review of Systems  Constitutional: Negative for appetite change, diaphoresis, fatigue and unexpected weight change.  Eyes: Negative for pain, redness and visual disturbance.  Respiratory: Negative for cough, chest tightness, shortness of breath and wheezing.   Cardiovascular: Negative for chest pain, palpitations and leg swelling.  Endocrine: Negative for cold intolerance, heat intolerance, polydipsia, polyphagia and polyuria.  Genitourinary: Negative for difficulty urinating, dysuria and frequency.  Neurological: Negative for dizziness, light-headedness, numbness and headaches.    History Past Medical History:  Diagnosis Date  . Depression   . GERD (gastroesophageal reflux disease)     She has a past surgical history that includes dnc; tubal reversal; Tonsillectomy; Cesarean section; and laparascopy.   Her family history includes Bipolar disorder in her sister; Depression in her unknown relative; Diabetes in her father and mother.She reports that she has never smoked. She has never used smokeless tobacco. She reports that she does not drink alcohol or use drugs.  Current Outpatient Medications on File Prior to Visit  Medication Sig Dispense Refill  . clindamycin (CLEOCIN T) 1 % lotion Apply bid 60 mL 2  . gabapentin (NEURONTIN) 100 MG capsule Take 1 capsule (100 mg total) by mouth 3 (three) times daily. 90 capsule 0  . naproxen (NAPROSYN) 500 MG tablet TAKE ONE TABLET BY MOUTH TWICE A DAY WITH MEAL(S) 30 tablet 2  . sertraline (ZOLOFT) 100 MG tablet  TAKE 2 TABLETS BY MOUTH EVERY DAY AS DIRECTED 180 tablet 3  . baclofen (LIORESAL) 10 MG tablet TAKE ONE TABLET BY MOUTH THREE TIMES A DAY (Patient not taking: Reported on 12/05/2019) 45 tablet 0  . pantoprazole (PROTONIX) 40 MG tablet Take 1 tablet (40 mg total) by mouth daily. (Patient not taking: Reported on 12/05/2019) 90 tablet 3   No current facility-administered medications on file prior to visit.     Objective:  Objective  Physical Exam Vitals and nursing note reviewed.  Constitutional:      Appearance: She is well-developed.  HENT:     Head: Normocephalic and atraumatic.  Eyes:     Conjunctiva/sclera: Conjunctivae normal.  Neck:     Thyroid: No thyromegaly.     Vascular: No carotid bruit or JVD.  Cardiovascular:     Rate and Rhythm: Normal rate and regular rhythm.     Heart sounds: Normal heart sounds. No murmur.  Pulmonary:     Effort: Pulmonary effort is normal. No respiratory distress.     Breath sounds: Normal breath sounds. No wheezing or rales.  Chest:     Chest wall: No tenderness.  Musculoskeletal:     Cervical back: Normal range of motion and neck supple.  Neurological:     Mental Status: She is alert and oriented to person, place, and time.    BP 118/80 (BP Location: Left Arm, Patient Position: Sitting, Cuff Size: Large)   Pulse 88   Temp 97.9 F (36.6 C) (Temporal)   Resp 18   Ht  5\' 2"  (1.575 m)   Wt 234 lb 6.4 oz (106.3 kg)   SpO2 98%   BMI 42.87 kg/m  Wt Readings from Last 3 Encounters:  12/05/19 234 lb 6.4 oz (106.3 kg)  07/06/19 209 lb (94.8 kg)  06/27/18 235 lb 9.6 oz (106.9 kg)     Lab Results  Component Value Date   WBC 6.3 01/01/2017   HGB 12.8 01/01/2017   HCT 37.7 01/01/2017   PLT 328.0 01/01/2017   GLUCOSE 92 01/01/2017   CHOL 247 (H) 09/25/2016   TRIG 95 09/25/2016   HDL 67 09/25/2016   LDLCALC 161 (H) 09/25/2016   ALT 26 01/01/2017   AST 23 01/01/2017   NA 135 01/01/2017   K 3.9 01/01/2017   CL 103 01/01/2017    CREATININE 0.52 01/01/2017   BUN 19 01/01/2017   CO2 25 01/01/2017   TSH 1.30 01/01/2017    No results found.   Assessment & Plan:  Plan  I have changed Marrissa Doolen's clonazePAM. I am also having her maintain her pantoprazole, naproxen, baclofen, clindamycin, sertraline, gabapentin, and buPROPion.  Meds ordered this encounter  Medications  . buPROPion (WELLBUTRIN XL) 150 MG 24 hr tablet    Sig: TAKE 2 TABLETS(300 MG) BY MOUTH DAILY.    Dispense:  180 tablet    Refill:  3  . clonazePAM (KLONOPIN) 1 MG tablet    Sig: Take 1 tablet (1 mg total) by mouth 2 (two) times daily as needed.    Dispense:  60 tablet    Refill:  0    Problem List Items Addressed This Visit      Unprioritized   DEPRESSION/ANXIETY    Stable con't wellbutrin and klonopin F/u with new pcp      Relevant Medications   buPROPion (WELLBUTRIN XL) 150 MG 24 hr tablet   Morbid obesity ([place])    F/u bariatric surgery for lap band        Other Visit Diagnoses    High risk medication use    -  Primary   Relevant Orders   Pain Mgmt, Profile 8 w/Conf, U   Anxiety and depression       Relevant Medications   buPROPion (WELLBUTRIN XL) 150 MG 24 hr tablet   Generalized anxiety disorder       Relevant Medications   buPROPion (WELLBUTRIN XL) 150 MG 24 hr tablet   clonazePAM (KLONOPIN) 1 MG tablet      Follow-up: Return if symptoms worsen or fail to improve.  Ann Held, DO

## 2019-12-05 NOTE — Patient Instructions (Signed)

## 2019-12-05 NOTE — Assessment & Plan Note (Signed)
Stable con't wellbutrin and klonopin F/u with new pcp

## 2019-12-05 NOTE — Assessment & Plan Note (Signed)
F/u bariatric surgery for lap band

## 2019-12-07 LAB — PAIN MGMT, PROFILE 8 W/CONF, U
6 Acetylmorphine: NEGATIVE ng/mL
Alcohol Metabolites: NEGATIVE ng/mL (ref <500)
Alphahydroxyalprazolam: NEGATIVE ng/mL
Alphahydroxymidazolam: NEGATIVE ng/mL
Alphahydroxytriazolam: NEGATIVE ng/mL
Aminoclonazepam: 487 ng/mL
Amphetamines: NEGATIVE ng/mL
Benzodiazepines: POSITIVE ng/mL
Buprenorphine, Urine: NEGATIVE ng/mL
Cocaine Metabolite: NEGATIVE ng/mL
Creatinine: 187.4 mg/dL
Hydroxyethylflurazepam: NEGATIVE ng/mL
Lorazepam: NEGATIVE ng/mL
MDMA: NEGATIVE ng/mL
Marijuana Metabolite: 168 ng/mL
Marijuana Metabolite: POSITIVE ng/mL
Nordiazepam: NEGATIVE ng/mL
Opiates: NEGATIVE ng/mL
Oxazepam: NEGATIVE ng/mL
Oxidant: NEGATIVE ug/mL
Oxycodone: NEGATIVE ng/mL
Temazepam: NEGATIVE ng/mL
pH: 7.3 (ref 4.5–9.0)

## 2019-12-15 ENCOUNTER — Encounter: Payer: Self-pay | Admitting: Family Medicine

## 2019-12-15 NOTE — Telephone Encounter (Signed)
Agree with ov

## 2019-12-23 ENCOUNTER — Encounter: Payer: Self-pay | Admitting: Family Medicine

## 2020-01-10 ENCOUNTER — Encounter: Payer: Self-pay | Admitting: Family Medicine

## 2020-01-10 ENCOUNTER — Other Ambulatory Visit: Payer: Self-pay | Admitting: Family Medicine

## 2020-01-10 DIAGNOSIS — F411 Generalized anxiety disorder: Secondary | ICD-10-CM

## 2020-01-10 NOTE — Telephone Encounter (Signed)
Please see previous message below. Pt is wanting a letter for surgical clearance sent over for bariatric surgery. I advised patient she would need a OV to have a EKG and labs done but states she just had appointment. Please advise     Previous message During my last appointment with Dr. Zola Button, last week, we discussed my desire to get bariatric surgery. She asked me to forward her the paperwork she would need to provide to my surgeons patient coordinator.

## 2020-01-10 NOTE — Telephone Encounter (Signed)
Clonazepam refill.   Last OV: 12/05/2019  Last Fill: 12/05/19 #60 and 0RF UDS: 12/05/19 Low risk

## 2020-01-11 NOTE — Telephone Encounter (Signed)
We did discuss surgery ---- pt needs EKG

## 2020-02-08 ENCOUNTER — Ambulatory Visit: Payer: 59 | Admitting: Family Medicine

## 2020-02-15 ENCOUNTER — Other Ambulatory Visit: Payer: Self-pay

## 2020-02-15 ENCOUNTER — Encounter: Payer: Self-pay | Admitting: Family Medicine

## 2020-02-15 ENCOUNTER — Ambulatory Visit (INDEPENDENT_AMBULATORY_CARE_PROVIDER_SITE_OTHER): Payer: 59 | Admitting: Family Medicine

## 2020-02-15 VITALS — BP 133/84 | HR 93 | Temp 97.3°F | Resp 18 | Ht 62.0 in | Wt 238.1 lb

## 2020-02-15 DIAGNOSIS — Z6841 Body Mass Index (BMI) 40.0 and over, adult: Secondary | ICD-10-CM

## 2020-02-15 DIAGNOSIS — F341 Dysthymic disorder: Secondary | ICD-10-CM | POA: Diagnosis not present

## 2020-02-15 NOTE — Patient Instructions (Signed)
Bariatric Surgery Information Bariatric surgery, also called weight loss surgery, is a procedure that helps you lose weight. You may consider, or your health care provider may suggest, bariatric surgery if:  You are severely obese and have been unable to lose weight through diet and exercise.  You have health problems related to obesity, such as: ? Type 2 diabetes. ? Heart disease. ? Lung disease. How does bariatric surgery help me lose weight? Bariatric surgery helps you lose weight by:  Decreasing how much food your body absorbs. This is done by closing off part of your stomach to make it smaller. This restricts the amount of food your stomach can hold.  Changing your body's regular digestive process so that food bypasses the parts of your body that absorb calories and nutrients. If you decide to have bariatric surgery, it is important to continue to eat a healthy diet and exercise regularly after the surgery. What are the different kinds of bariatric surgery? There are two kinds of bariatric surgeries:  Restrictive surgery. This procedure makes your stomach smaller. It does not change your digestive process. The smaller the size of your new stomach, the less food you can eat. There are different types of restrictive surgeries.  Malabsorptive surgery. This procedure makes your stomach smaller and alters your digestive process so that your body processes less calories and nutrients. These are the most common kind of bariatric surgery. There are different types of malabsorptive surgeries. What are the different types of restrictive surgery? Adjustable Gastric Banding In this procedure, an inflatable band is placed around your stomach near the upper end. This makes the passageway for food into the rest of your stomach much smaller. The band can be adjusted, making it tighter or looser, by filling it with salt solution. Your surgeon can adjust the band based on how you are feeling and how much  weight you are losing. The band can be removed in the future. This requires another surgery. Sleeve Gastrectomy In this procedure, your stomach is made smaller. This is done by surgically removing a large part of your stomach. When your stomach is smaller, you feel full more quickly and reduce how much you eat. What are the different types of malabsorptive surgery?  Roux-en-Y Gastric Bypass (RGB) This is the most common weight loss surgery. In this procedure, a small stomach pouch (gastric pouch) is created in the upper part of your stomach. Next, this gastric pouch is attached directly to the middle part of your small intestine. The farther down your small intestine the new connection is made, the fewer calories and nutrients you will absorb. This surgery has the highest rate of complications. Biliopancreatic Diversion with Duodenal Switch (BPD/DS) This is a multi-step procedure. First, a large part of your stomach is removed, making your stomach smaller. Next, this smaller stomach is attached to the lower part of your small intestine. Like the RGB surgery, you absorb fewer calories and nutrients the farther down your small intestine the attachment is made. What are the risks of bariatric surgery? As with any surgical procedure, each type of bariatric surgery has its own risks. These risks also depend on your age, your overall health, and any other medical conditions you may have. When deciding on bariatric surgery, it is very important to:  Talk to your health care provider and choose the surgery that is best for you.  Ask your health care provider about specific risks for the surgery you choose. Generally, the risks of bariatric surgery include:    Infection.  Bleeding.  Not getting enough nutrients from food (nutritional deficiencies).  Failure of the device or procedure. This may require another surgery to correct the problem. Where to find more information  American Society for  Metabolic & Bariatric Surgery: www.asmbs.org  Weight-control Information Network (WIN): win.niddk.nih.gov Summary  Bariatric surgery, also called weight loss surgery, is a procedure that helps you lose weight.  This surgery may be recommended if you have diabetes, heart disease, or lung disease.  Generally, risks of bariatric surgery include infection, bleeding, and failure of the surgery or device, which may require another surgery to correct the problem. This information is not intended to replace advice given to you by your health care provider. Make sure you discuss any questions you have with your health care provider. Document Revised: 12/27/2018 Document Reviewed: 10/12/2016 Elsevier Patient Education  2020 Elsevier Inc.  

## 2020-02-15 NOTE — Progress Notes (Signed)
Patient ID: Veronica Yates, female    DOB: [birth year]  Age: 49 y.o. MRN: [id number]    Subjective:     Objective:     Assessment & Plan:      .

## 2020-02-19 NOTE — Assessment & Plan Note (Signed)
Pt will drop off paperwork from surgeon so letter can be done

## 2020-02-19 NOTE — Assessment & Plan Note (Signed)
Stable con't meds 

## 2020-02-19 NOTE — Progress Notes (Signed)
New Patient Office Visit  Subjective:  Patient ID: Veronica Yates, female    DOB: June 06, 1971  Age: 49 y.o. MRN: [id number]  CC:  Chief Complaint  Patient presents with  . Surgical clearance     Veronica Yates presents to discuss gastric sleeve to be done in [place] -- Dr Pearlie Oyster   She needs a letter from [place] saying she is a good candidate for surgery.   She will bring in the paper work for [place] .    Past Medical History:  Diagnosis Date  . Depression   . GERD (gastroesophageal reflux disease)     Past Surgical History:  Procedure Laterality Date  . CESAREAN SECTION    . dnc    . laparascopy    . TONSILLECTOMY    . tubal reversal      Family History  Problem Relation Age of Onset  . Depression Unknown   . Bipolar disorder Sister   . Diabetes Mother   . Diabetes Father     Social History   Socioeconomic History  . Marital status: Married    Spouse name: Not on file  . Number of children: Not on file  . Years of education: Not on file  . Highest education level: Not on file  Occupational History  . Occupation: [employer]  Tobacco Use  . Smoking status: Never Smoker  . Smokeless tobacco: Never Used  Substance and Sexual Activity  . Alcohol use: No  . Drug use: No  . Sexual activity: Not on file  Other Topics Concern  . Not on file  Social History Narrative  . Not on file   Social Determinants of Health   Financial Resource Strain:   . Difficulty of Paying Living Expenses:   Food Insecurity:   . Worried About Charity fundraiser in the Last Year:   . Arboriculturist in the Last Year:   Transportation Needs:   . Film/video editor (Medical):   Marland Kitchen Lack of Transportation (Non-Medical):   Physical Activity:   . Days of Exercise per Week:   . Minutes of Exercise per Session:   Stress:   . Feeling of Stress :   Social Connections:   . Frequency of Communication with Friends and Family:   . Frequency of Social Gatherings with Friends  and Family:   . Attends Religious Services:   . Active Member of Clubs or Organizations:   . Attends Archivist Meetings:   Marland Kitchen Marital Status:   Intimate Partner Violence:   . Fear of Current or Ex-Partner:   . Emotionally Abused:   Marland Kitchen Physically Abused:   . Sexually Abused:     ROS Review of Systems  Constitutional: Negative for appetite change, diaphoresis, fatigue and unexpected weight change.  Eyes: Negative for pain, redness and visual disturbance.  Respiratory: Negative for cough, chest tightness, shortness of breath and wheezing.   Cardiovascular: Negative for chest pain, palpitations and leg swelling.  Endocrine: Negative for cold intolerance, heat intolerance, polydipsia, polyphagia and polyuria.  Genitourinary: Negative for difficulty urinating, dysuria and frequency.  Neurological: Negative for dizziness, light-headedness, numbness and headaches.    Objective:   Today's Vitals: BP 133/84 (BP Location: Left Arm, Patient Position: Sitting, Cuff Size: Normal)   Pulse 93   Temp (!) 97.3 F (36.3 C) (Temporal)   Resp 18   Ht 5\' 2"  (1.575 m)   Wt 238 lb 2 oz (108 kg)   SpO2 96%  BMI 43.55 kg/m   Physical Exam Vitals and nursing note reviewed.  Constitutional:      Appearance: She is well-developed.  HENT:     Head: Normocephalic and atraumatic.  Eyes:     Conjunctiva/sclera: Conjunctivae normal.  Neck:     Thyroid: No thyromegaly.     Vascular: No carotid bruit or JVD.  Cardiovascular:     Rate and Rhythm: Normal rate and regular rhythm.     Heart sounds: Normal heart sounds. No murmur.  Pulmonary:     Effort: Pulmonary effort is normal. No respiratory distress.     Breath sounds: Normal breath sounds. No wheezing or rales.  Chest:     Chest wall: No tenderness.  Musculoskeletal:     Cervical back: Normal range of motion and neck supple.  Neurological:     Mental Status: She is alert and oriented to person, place, and time.     Assessment &  Plan:   Problem List Items Addressed This Visit    None    Visit Diagnoses    Preoperative clearance    -  Primary      Outpatient Encounter Medications as of 02/15/2020  Medication Sig  . baclofen (LIORESAL) 10 MG tablet TAKE ONE TABLET BY MOUTH THREE TIMES A DAY  . buPROPion (WELLBUTRIN XL) 150 MG 24 hr tablet TAKE 2 TABLETS(300 MG) BY MOUTH DAILY.  . clindamycin (CLEOCIN T) 1 % lotion Apply bid  . clonazePAM (KLONOPIN) 1 MG tablet TAKE ONE TABLET BY MOUTH TWICE A DAY AS NEEDED  . gabapentin (NEURONTIN) 100 MG capsule Take 1 capsule (100 mg total) by mouth 3 (three) times daily.  . naproxen (NAPROSYN) 500 MG tablet TAKE ONE TABLET BY MOUTH TWICE A DAY WITH MEAL(S)  . pantoprazole (PROTONIX) 40 MG tablet Take 1 tablet (40 mg total) by mouth daily.  . sertraline (ZOLOFT) 100 MG tablet TAKE 2 TABLETS BY MOUTH EVERY DAY AS DIRECTED   No facility-administered encounter medications on file as of 02/15/2020.    Follow-up: Return if symptoms worsen or fail to improve.   Donato Schultz, DO

## 2020-02-20 ENCOUNTER — Other Ambulatory Visit: Payer: Self-pay | Admitting: Family Medicine

## 2020-02-20 DIAGNOSIS — F411 Generalized anxiety disorder: Secondary | ICD-10-CM

## 2020-02-21 NOTE — Telephone Encounter (Signed)
Requesting: Klonopin Contract: 12/21/2019 UDS: 12/06/2019 Last OV: 02/15/20 Next OV: N/A Last Refill: 01/11/20, #60--0 RF Database:   Please advise

## 2020-03-14 ENCOUNTER — Encounter: Payer: Self-pay | Admitting: Family Medicine

## 2020-03-18 ENCOUNTER — Encounter: Payer: Self-pay | Admitting: Family Medicine

## 2020-03-18 NOTE — Telephone Encounter (Signed)
Letter done and given to [employer]

## 2020-03-18 NOTE — Telephone Encounter (Signed)
Please advise 

## 2020-03-26 NOTE — Telephone Encounter (Signed)
Letter faxed to number provided.

## 2020-03-29 ENCOUNTER — Other Ambulatory Visit: Payer: Self-pay | Admitting: Family Medicine

## 2020-03-29 DIAGNOSIS — F411 Generalized anxiety disorder: Secondary | ICD-10-CM

## 2020-03-29 NOTE — Telephone Encounter (Signed)
Requesting: Klonopin Contract: 12/10/19 UDS: 12/05/2019, low risk Last OV: 02/15/2020 Next OV: N/A Last Refill: 02/21/2020, #60--0 RF Database:   Please advise

## 2020-05-09 ENCOUNTER — Other Ambulatory Visit: Payer: Self-pay | Admitting: Family Medicine

## 2020-05-09 DIAGNOSIS — F411 Generalized anxiety disorder: Secondary | ICD-10-CM

## 2020-05-09 NOTE — Telephone Encounter (Signed)
Clonazepam refill.   Last OV: 02/15/2020 Last Fill: 03/29/2020 #60 and 0RF Pt sig: 1 tab bid prn UDS: 12/05/19 Low risk

## 2020-06-21 ENCOUNTER — Other Ambulatory Visit: Payer: Self-pay | Admitting: Family Medicine

## 2020-06-21 DIAGNOSIS — F411 Generalized anxiety disorder: Secondary | ICD-10-CM

## 2020-06-22 NOTE — Telephone Encounter (Signed)
Requesting: klonpnn Contract:12/21/19 UDS:12/05/19 Last Visit:02/15/20 Next Visit: n/a Last Refill:05/09/20  Please Advise

## 2020-07-25 ENCOUNTER — Other Ambulatory Visit: Payer: Self-pay

## 2020-07-25 DIAGNOSIS — F32A Depression, unspecified: Secondary | ICD-10-CM

## 2020-07-25 MED ORDER — SERTRALINE HCL 100 MG PO TABS: ORAL_TABLET | ORAL | 0 refills | Status: DC

## 2020-07-31 ENCOUNTER — Other Ambulatory Visit: Payer: Self-pay | Admitting: Family Medicine

## 2020-07-31 DIAGNOSIS — F411 Generalized anxiety disorder: Secondary | ICD-10-CM

## 2020-07-31 NOTE — Telephone Encounter (Signed)
Medication:  clonazePAM (KLONOPIN) 1 MG tablet [[id number]]     Has the patient contacted their pharmacy?  (If no, request that the patient contact the pharmacy for the refill.) (If yes, when and what did the pharmacy advise?)     Preferred Pharmacy (with phone number or street name): [id number] [place] - [place], [place] - [address]  [address] [place] [address]  Phone:  [phone] Fax:  [phone]      Agent: Please be advised that RX refills may take up to 3 business days. We ask that you follow-up with your pharmacy.

## 2020-08-01 MED ORDER — CLONAZEPAM 1 MG PO TABS: 1.0000 mg | ORAL_TABLET | Freq: Two times a day (BID) | ORAL | 0 refills | Status: DC | PRN

## 2020-08-01 NOTE — Telephone Encounter (Signed)
Requesting: Klonopin Contract:12/05/19 UDS: 12/05/19 Last OV: 02/15/20 Next OV: 08/27/20 Last Refill: 06/24/2020, #60--0 RF Database:   Please advise

## 2020-08-27 ENCOUNTER — Ambulatory Visit: Payer: 59 | Admitting: Family Medicine

## 2020-08-27 DIAGNOSIS — Z0289 Encounter for other administrative examinations: Secondary | ICD-10-CM

## 2020-09-18 ENCOUNTER — Telehealth: Payer: Self-pay | Admitting: Family Medicine

## 2020-09-18 ENCOUNTER — Other Ambulatory Visit: Payer: Self-pay | Admitting: Family Medicine

## 2020-09-18 DIAGNOSIS — F411 Generalized anxiety disorder: Secondary | ICD-10-CM

## 2020-09-18 MED ORDER — CLONAZEPAM 1 MG PO TABS: 1.0000 mg | ORAL_TABLET | Freq: Two times a day (BID) | ORAL | 0 refills | Status: DC | PRN

## 2020-09-18 NOTE — Telephone Encounter (Signed)
Pt sent message asking if she could do Mychart visit for med refill  Please advise

## 2020-09-18 NOTE — Telephone Encounter (Signed)
I scheduled appt with pt on 10/03/20 @ 10 am  She is going to be out of town until then and is needing a refill on klonopin 1mg    Took last one this morning 09/18/2020

## 2020-09-18 NOTE — Telephone Encounter (Signed)
She will more than likely need labs. Recommend in person visit.

## 2020-09-18 NOTE — Telephone Encounter (Signed)
Requesting: clonazepam 1mg  Contract: 12/05/2019 UDS: 12/05/2019 Last Visit: 02/15/2020 Next Visit: None Last Refill: 08/01/2020 #60 and 0RF  [place] Pt  Please Advise

## 2020-09-18 NOTE — Telephone Encounter (Signed)
Requesting a refill to be [place] a pharmacy [place] of [place], send #15 no refills, further refills per PCP

## 2020-10-03 ENCOUNTER — Ambulatory Visit: Payer: 59 | Admitting: Family Medicine

## 2020-10-07 ENCOUNTER — Telehealth: Payer: Self-pay | Admitting: *Deleted

## 2020-10-08 ENCOUNTER — Encounter: Payer: Self-pay | Admitting: Family Medicine

## 2020-10-08 ENCOUNTER — Ambulatory Visit (INDEPENDENT_AMBULATORY_CARE_PROVIDER_SITE_OTHER): Payer: 59 | Admitting: Family Medicine

## 2020-10-08 DIAGNOSIS — F411 Generalized anxiety disorder: Secondary | ICD-10-CM | POA: Diagnosis not present

## 2020-10-08 DIAGNOSIS — F32A Depression, unspecified: Secondary | ICD-10-CM | POA: Diagnosis not present

## 2020-10-08 DIAGNOSIS — F419 Anxiety disorder, unspecified: Secondary | ICD-10-CM

## 2020-10-08 MED ORDER — BUPROPION HCL ER (XL) 150 MG PO TB24: ORAL_TABLET | ORAL | 3 refills | Status: DC

## 2020-10-08 MED ORDER — CLONAZEPAM 1 MG PO TABS: 1.0000 mg | ORAL_TABLET | Freq: Two times a day (BID) | ORAL | 0 refills | Status: DC | PRN

## 2020-10-08 MED ORDER — SERTRALINE HCL 100 MG PO TABS: ORAL_TABLET | ORAL | 0 refills | Status: DC

## 2020-10-08 NOTE — Progress Notes (Signed)
Virtual Visit via Video Note  I connected with Veronica Yates on 10/08/20 at  9:00 AM EST by a video enabled telemedicine application and verified that I am speaking with the correct person using two identifiers.  Location/ people in visit  Patient: home alone  Provider: home    I discussed the limitations of evaluation and management by telemedicine and the availability of in person appointments. The patient expressed understanding and agreed to proceed.  History of Present Illness: Pt is home and needs refills on her anxiety meds.  She is doing well -- has no complaints.  She had gastric sleeve surgery in Oct and has lost 50 lbs    Observations/Objective: No vitals obtained Pt in nad  Wt 189 Assessment and Plan: 1. Anxiety and depression Stable Refill meds  - sertraline (ZOLOFT) 100 MG tablet; TAKE 2 TABLETS BY MOUTH EVERY DAY AS DIRECTED  Dispense: 180 tablet; Refill: 0 - buPROPion (WELLBUTRIN XL) 150 MG 24 hr tablet; TAKE 2 TABLETS(300 MG) BY MOUTH DAILY.  Dispense: 180 tablet; Refill: 3  2. Generalized anxiety disorder Stable Refill meds  - clonazePAM (KLONOPIN) 1 MG tablet; Take 1 tablet (1 mg total) by mouth 2 (two) times daily as needed.  Dispense: 15 tablet; Refill: 0   Follow Up Instructions:    I discussed the assessment and treatment plan with the patient. The patient was provided an opportunity to ask questions and all were answered. The patient agreed with the plan and demonstrated an understanding of the instructions.   The patient was advised to call back or seek an in-person evaluation if the symptoms worsen or if the condition fails to improve as anticipated.  I provided 25 minutes of non-face-to-face time during this encounter.   Donato Schultz, DO

## 2020-10-09 ENCOUNTER — Other Ambulatory Visit: Payer: Self-pay | Admitting: Family Medicine

## 2020-10-09 DIAGNOSIS — F411 Generalized anxiety disorder: Secondary | ICD-10-CM

## 2020-10-09 MED ORDER — CLONAZEPAM 1 MG PO TABS: 1.0000 mg | ORAL_TABLET | Freq: Two times a day (BID) | ORAL | 0 refills | Status: DC | PRN

## 2020-10-09 NOTE — Telephone Encounter (Signed)
Sent mychart message to pt. 

## 2020-10-09 NOTE — Telephone Encounter (Signed)
Dr Zola Button --  Please see pt's request for 30 Klonopin and advise?

## 2020-10-09 NOTE — Telephone Encounter (Signed)
Sent it in again for #30

## 2020-10-23 ENCOUNTER — Other Ambulatory Visit: Payer: Self-pay | Admitting: Family Medicine

## 2020-10-23 DIAGNOSIS — F411 Generalized anxiety disorder: Secondary | ICD-10-CM

## 2020-10-23 MED ORDER — CLONAZEPAM 1 MG PO TABS: 1.0000 mg | ORAL_TABLET | Freq: Two times a day (BID) | ORAL | 1 refills | Status: DC | PRN

## 2020-10-23 NOTE — Telephone Encounter (Signed)
done

## 2020-10-23 NOTE — Telephone Encounter (Signed)
Dr Zola Button -- last Clonazepam RX for pt on 10/09/20 was for 30 tablets. History shows we have been prescribing 60. Pt had incorrectly stated that she was taking 1 a day in her previous message when she actually takes 2 a day. Can you re-send Rx for 60 tablets?  If so, I will call pharmacy and cancel RX for 30.

## 2020-11-19 ENCOUNTER — Ambulatory Visit: Payer: 59 | Admitting: Family Medicine

## 2020-12-06 ENCOUNTER — Other Ambulatory Visit: Payer: Self-pay | Admitting: Family Medicine

## 2020-12-06 DIAGNOSIS — F411 Generalized anxiety disorder: Secondary | ICD-10-CM

## 2020-12-06 NOTE — Telephone Encounter (Signed)
Requesting: Klonopin Contract: 12/05/2019 UDS: 12/05/2019 Last OV: 10/08/2020 Next OV: 12/10/2020 Last Refill: 10/23/2020, #60--1 RF Database:   Please advise

## 2020-12-10 ENCOUNTER — Ambulatory Visit (INDEPENDENT_AMBULATORY_CARE_PROVIDER_SITE_OTHER): Payer: 59 | Admitting: Family Medicine

## 2020-12-10 ENCOUNTER — Other Ambulatory Visit: Payer: Self-pay

## 2020-12-10 ENCOUNTER — Encounter: Payer: Self-pay | Admitting: Family Medicine

## 2020-12-10 VITALS — BP 100/70 | HR 74 | Temp 98.3°F | Resp 18 | Ht 62.0 in | Wt 184.4 lb

## 2020-12-10 DIAGNOSIS — F341 Dysthymic disorder: Secondary | ICD-10-CM

## 2020-12-10 DIAGNOSIS — F411 Generalized anxiety disorder: Secondary | ICD-10-CM

## 2020-12-10 DIAGNOSIS — Z79899 Other long term (current) drug therapy: Secondary | ICD-10-CM | POA: Diagnosis not present

## 2020-12-10 DIAGNOSIS — F419 Anxiety disorder, unspecified: Secondary | ICD-10-CM | POA: Diagnosis not present

## 2020-12-10 DIAGNOSIS — F32A Depression, unspecified: Secondary | ICD-10-CM

## 2020-12-10 DIAGNOSIS — L700 Acne vulgaris: Secondary | ICD-10-CM

## 2020-12-10 MED ORDER — BUPROPION HCL ER (XL) 150 MG PO TB24
ORAL_TABLET | ORAL | 3 refills | Status: DC
Start: 2020-12-10 — End: 2021-01-20

## 2020-12-10 MED ORDER — CLINDAMYCIN PHOSPHATE 1 % EX LOTN: TOPICAL_LOTION | CUTANEOUS | 3 refills | Status: AC

## 2020-12-10 MED ORDER — SERTRALINE HCL 100 MG PO TABS
ORAL_TABLET | ORAL | 3 refills | Status: DC
Start: 2020-12-10 — End: 2022-02-20

## 2020-12-10 MED ORDER — CLONAZEPAM 1 MG PO TABS: 1.0000 mg | ORAL_TABLET | Freq: Two times a day (BID) | ORAL | 1 refills | Status: DC | PRN

## 2020-12-10 NOTE — Assessment & Plan Note (Signed)
Database reviewed uds and contract renewed  Refills sent  F/u 6 months or sooner prn

## 2020-12-10 NOTE — Progress Notes (Signed)
Patient ID: Veronica Yates, female    DOB: 1970-12-08  Age: 50 y.o. MRN: [id number]    Subjective:  Subjective  HPI Veronica Yates presents for f/u anxiety and depression    No complaints   Review of Systems  Constitutional: Negative for appetite change, diaphoresis, fatigue and unexpected weight change.  Eyes: Negative for pain, redness and visual disturbance.  Respiratory: Negative for cough, chest tightness, shortness of breath and wheezing.   Cardiovascular: Negative for chest pain, palpitations and leg swelling.  Endocrine: Negative for cold intolerance, heat intolerance, polydipsia, polyphagia and polyuria.  Genitourinary: Negative for difficulty urinating, dysuria and frequency.  Neurological: Negative for dizziness, light-headedness, numbness and headaches.    History Past Medical History:  Diagnosis Date  . Depression   . GERD (gastroesophageal reflux disease)     She has a past surgical history that includes dnc; tubal reversal; Tonsillectomy; Cesarean section; and laparascopy.   Her family history includes Bipolar disorder in her sister; Depression in her unknown relative; Diabetes in her father and mother.She reports that she has never smoked. She has never used smokeless tobacco. She reports that she does not drink alcohol and does not use drugs.  Current Outpatient Medications on File Prior to Visit  Medication Sig Dispense Refill  . clonazePAM (KLONOPIN) 1 MG tablet TAKE ONE TABLET BY MOUTH TWICE A DAY AS NEEDED 60 tablet 1   No current facility-administered medications on file prior to visit.     Objective:  Objective  Physical Exam Vitals and nursing note reviewed.  Constitutional:      Appearance: She is well-developed.  HENT:     Head: Normocephalic and atraumatic.  Eyes:     Conjunctiva/sclera: Conjunctivae normal.  Neck:     Thyroid: No thyromegaly.     Vascular: No carotid bruit or JVD.  Cardiovascular:     Rate and Rhythm: Normal rate and  regular rhythm.     Heart sounds: Normal heart sounds. No murmur heard.   Pulmonary:     Effort: Pulmonary effort is normal. No respiratory distress.     Breath sounds: Normal breath sounds. No wheezing or rales.  Chest:     Chest wall: No tenderness.  Musculoskeletal:     Cervical back: Normal range of motion and neck supple.  Neurological:     Mental Status: She is alert and oriented to person, place, and time.  Psychiatric:        Mood and Affect: Mood is anxious and depressed.    BP 100/70 (BP Location: Right Arm, Patient Position: Sitting, Cuff Size: Normal)   Pulse 74   Temp 98.3 F (36.8 C) (Oral)   Resp 18   Ht 5\' 2"  (1.575 m)   Wt 184 lb 6.4 oz (83.6 kg)   SpO2 97%   BMI 33.73 kg/m  Wt Readings from Last 3 Encounters:  12/10/20 184 lb 6.4 oz (83.6 kg)  10/08/20 195 lb (88.5 kg)  02/15/20 238 lb 2 oz (108 kg)     Lab Results  Component Value Date   WBC 6.3 01/01/2017   HGB 12.8 01/01/2017   HCT 37.7 01/01/2017   PLT 328.0 01/01/2017   GLUCOSE 92 01/01/2017   CHOL 247 (H) 09/25/2016   TRIG 95 09/25/2016   HDL 67 09/25/2016   LDLCALC 161 (H) 09/25/2016   ALT 26 01/01/2017   AST 23 01/01/2017   NA 135 01/01/2017   K 3.9 01/01/2017   CL 103 01/01/2017   CREATININE 0.52 01/01/2017  BUN 19 01/01/2017   CO2 25 01/01/2017   TSH 1.30 01/01/2017    No results found.   Assessment & Plan:  Plan  I am having Kealani Schaafsma maintain her clonazePAM, sertraline, buPROPion, and clindamycin.  Meds ordered this encounter  Medications  . sertraline (ZOLOFT) 100 MG tablet    Sig: TAKE 2 TABLETS BY MOUTH EVERY DAY AS DIRECTED    Dispense:  180 tablet    Refill:  3  . buPROPion (WELLBUTRIN XL) 150 MG 24 hr tablet    Sig: TAKE 2 TABLETS(300 MG) BY MOUTH DAILY.    Dispense:  180 tablet    Refill:  3  . clindamycin (CLEOCIN T) 1 % lotion    Sig: Apply bid    Dispense:  60 mL    Refill:  3    Problem List Items Addressed This Visit   None   Visit  Diagnoses    High risk medication use    -  Primary   Relevant Orders   DRUG MONITORING, PANEL 8 WITH CONFIRMATION, URINE   Anxiety and depression       Relevant Medications   sertraline (ZOLOFT) 100 MG tablet   buPROPion (WELLBUTRIN XL) 150 MG 24 hr tablet   Generalized anxiety disorder       Relevant Medications   sertraline (ZOLOFT) 100 MG tablet   buPROPion (WELLBUTRIN XL) 150 MG 24 hr tablet   Acne vulgaris       Relevant Medications   clindamycin (CLEOCIN T) 1 % lotion      Follow-up: Return in about 6 months (around 06/12/2021), or if symptoms worsen or fail to improve, for annual exam, fasting.  Veronica Schultz, DO

## 2020-12-10 NOTE — Patient Instructions (Signed)
[email or web address]">  Generalized Anxiety Disorder, Adult Generalized anxiety disorder (GAD) is a mental health condition. Unlike normal worries, anxiety related to GAD is not triggered by a specific event. These worries do not fade or get better with time. GAD interferes with relationships, work, and school. GAD symptoms can vary from mild to severe. People with severe GAD can have intense waves of anxiety with physical symptoms that are similar to panic attacks. What are the causes? The exact cause of GAD is not known, but the following are believed to have an impact:  Differences in natural brain chemicals.  Genes passed down from parents to children.  Differences in the way threats are perceived.  Development during childhood.  Personality. What increases the risk? The following factors may make you more likely to develop this condition:  Being female.  Having a family history of anxiety disorders.  Being very shy.  Experiencing very stressful life events, such as the death of a loved one.  Having a very stressful family environment. What are the signs or symptoms? People with GAD often worry excessively about many things in their lives, such as their health and family. Symptoms may also include:  Mental and emotional symptoms: ? Worrying excessively about natural disasters. ? Fear of being late. ? Difficulty concentrating. ? Fears that others are judging your performance.  Physical symptoms: ? Fatigue. ? Headaches, muscle tension, muscle twitches, trembling, or feeling shaky. ? Feeling like your heart is pounding or beating very fast. ? Feeling out of breath or like you cannot take a deep breath. ? Having trouble falling asleep or staying asleep, or experiencing restlessness. ? Sweating. ? Nausea, diarrhea, or irritable bowel syndrome (IBS).  Behavioral symptoms: ? Experiencing erratic moods or irritability. ? Avoidance of new situations. ? Avoidance of  people. ? Extreme difficulty making decisions. How is this diagnosed? This condition is diagnosed based on your symptoms and medical history. You will also have a physical exam. Your health care provider may perform tests to rule out other possible causes of your symptoms. To be diagnosed with GAD, a person must have anxiety that:  Is out of his or her control.  Affects several different aspects of his or her life, such as work and relationships.  Causes distress that makes him or her unable to take part in normal activities.  Includes at least three symptoms of GAD, such as restlessness, fatigue, trouble concentrating, irritability, muscle tension, or sleep problems. Before your health care provider can confirm a diagnosis of GAD, these symptoms must be present more days than they are not, and they must last for 6 months or longer. How is this treated? This condition may be treated with:  Medicine. Antidepressant medicine is usually prescribed for long-term daily control. Anti-anxiety medicines may be added in severe cases, especially when panic attacks occur.  Talk therapy (psychotherapy). Certain types of talk therapy can be helpful in treating GAD by providing support, education, and guidance. Options include: ? Cognitive behavioral therapy (CBT). People learn coping skills and self-calming techniques to ease their physical symptoms. They learn to identify unrealistic thoughts and behaviors and to replace them with more appropriate thoughts and behaviors. ? Acceptance and commitment therapy (ACT). This treatment teaches people how to be mindful as a way to cope with unwanted thoughts and feelings. ? Biofeedback. This process trains you to manage your body's response (physiological response) through breathing techniques and relaxation methods. You will work with a therapist while machines are used to monitor your physical   symptoms.  Stress management techniques. These include yoga,  meditation, and exercise. A mental health specialist can help determine which treatment is best for you. Some people see improvement with one type of therapy. However, other people require a combination of therapies.   Follow these instructions at home: Lifestyle  Maintain a consistent routine and schedule.  Anticipate stressful situations. Create a plan, and allow extra time to work with your plan.  Practice stress management or self-calming techniques that you have learned from your therapist or your health care provider. General instructions  Take over-the-counter and prescription medicines only as told by your health care provider.  Understand that you are likely to have setbacks. Accept this and be kind to yourself as you persist to take better care of yourself.  Recognize and accept your accomplishments, even if you judge them as small.  Keep all follow-up visits as told by your health care provider. This is important. Contact a health care provider if:  Your symptoms do not get better.  Your symptoms get worse.  You have signs of depression, such as: ? A persistently sad or irritable mood. ? Loss of enjoyment in activities that used to bring you joy. ? Change in weight or eating. ? Changes in sleeping habits. ? Avoiding friends or family members. ? Loss of energy for normal tasks. ? Feelings of guilt or worthlessness. Get help right away if:  You have serious thoughts about hurting yourself or others. If you ever feel like you may hurt yourself or others, or have thoughts about taking your own life, get help right away. Go to your nearest emergency department or:  Call your local emergency services (911 in the [place]  Call a suicide crisis helpline, such as the National Suicide Prevention Lifeline at 1-800-273-8255. This is open 24 hours a day in the [place]  Text the Crisis Text Line at 741741 (in the [place] Summary  Generalized anxiety disorder (GAD) is a mental  health condition that involves worry that is not triggered by a specific event.  People with GAD often worry excessively about many things in their lives, such as their health and family.  GAD may cause symptoms such as restlessness, trouble concentrating, sleep problems, frequent sweating, nausea, diarrhea, headaches, and trembling or muscle twitching.  A mental health specialist can help determine which treatment is best for you. Some people see improvement with one type of therapy. However, other people require a combination of therapies. This information is not intended to replace advice given to you by your health care provider. Make sure you discuss any questions you have with your health care provider. Document Revised: 06/28/2019 Document Reviewed: 06/28/2019 Elsevier Patient Education  2021 Elsevier Inc.  

## 2020-12-12 LAB — DRUG MONITORING, PANEL 8 WITH CONFIRMATION, URINE
6 Acetylmorphine: NEGATIVE ng/mL (ref <10)
Alcohol Metabolites: NEGATIVE ng/mL
Alphahydroxyalprazolam: NEGATIVE ng/mL (ref <25)
Alphahydroxymidazolam: NEGATIVE ng/mL (ref <50)
Alphahydroxytriazolam: NEGATIVE ng/mL (ref <50)
Aminoclonazepam: 598 ng/mL — ABNORMAL HIGH (ref <25)
Amphetamines: NEGATIVE ng/mL (ref <500)
Benzodiazepines: POSITIVE ng/mL — AB (ref <100)
Buprenorphine, Urine: NEGATIVE ng/mL (ref <5)
Cocaine Metabolite: NEGATIVE ng/mL (ref <150)
Creatinine: 209.1 mg/dL
Hydroxyethylflurazepam: NEGATIVE ng/mL (ref <50)
Lorazepam: NEGATIVE ng/mL (ref <50)
MDMA: NEGATIVE ng/mL (ref <500)
Marijuana Metabolite: NEGATIVE ng/mL (ref <20)
Nordiazepam: NEGATIVE ng/mL (ref <50)
Opiates: NEGATIVE ng/mL (ref <100)
Oxazepam: NEGATIVE ng/mL (ref <50)
Oxidant: NEGATIVE ug/mL
Oxycodone: NEGATIVE ng/mL (ref <100)
Temazepam: NEGATIVE ng/mL (ref <50)
pH: 5.7 (ref 4.5–9.0)

## 2020-12-12 LAB — DM TEMPLATE

## 2021-01-19 ENCOUNTER — Other Ambulatory Visit: Payer: Self-pay | Admitting: Family Medicine

## 2021-01-19 DIAGNOSIS — F419 Anxiety disorder, unspecified: Secondary | ICD-10-CM

## 2021-01-19 DIAGNOSIS — F32A Depression, unspecified: Secondary | ICD-10-CM

## 2021-05-16 ENCOUNTER — Telehealth: Payer: Self-pay | Admitting: Family Medicine

## 2021-05-16 ENCOUNTER — Ambulatory Visit: Payer: 59 | Admitting: Family

## 2021-06-22 ENCOUNTER — Other Ambulatory Visit: Payer: Self-pay | Admitting: Family Medicine

## 2021-06-22 DIAGNOSIS — F411 Generalized anxiety disorder: Secondary | ICD-10-CM

## 2021-06-23 NOTE — Telephone Encounter (Signed)
Requesting: Klonopin Contract: 12/10/20 UDS: 12/10/20 Last OV: 12/10/20 Next OV: N/A Last Refill: 0/22/22, #30-- RF Database:   Please advise

## 2021-06-24 ENCOUNTER — Other Ambulatory Visit: Payer: Self-pay | Admitting: Family Medicine

## 2021-06-24 DIAGNOSIS — F411 Generalized anxiety disorder: Secondary | ICD-10-CM

## 2021-07-16 NOTE — Telephone Encounter (Signed)
error 

## 2021-10-02 ENCOUNTER — Telehealth: Payer: Self-pay | Admitting: Family Medicine

## 2021-10-02 ENCOUNTER — Telehealth (INDEPENDENT_AMBULATORY_CARE_PROVIDER_SITE_OTHER): Payer: 59 | Admitting: Family Medicine

## 2021-10-02 ENCOUNTER — Encounter: Payer: Self-pay | Admitting: Family Medicine

## 2021-10-02 ENCOUNTER — Telehealth: Payer: 59

## 2021-10-02 DIAGNOSIS — F988 Other specified behavioral and emotional disorders with onset usually occurring in childhood and adolescence: Secondary | ICD-10-CM | POA: Diagnosis not present

## 2021-10-02 MED ORDER — AMPHETAMINE-DEXTROAMPHET ER 20 MG PO CP24: 20.0000 mg | ORAL_CAPSULE | ORAL | 0 refills | Status: DC

## 2021-10-02 NOTE — Progress Notes (Signed)
MyChart Video Visit    Virtual Visit via Video Note   This visit type was conducted due to national recommendations for restrictions regarding the COVID-19 Pandemic (e.g. social distancing) in an effort to limit this patient's exposure and mitigate transmission in our community. This patient is at least at moderate risk for complications without adequate follow up. This format is felt to be most appropriate for this patient at this time. Physical exam was limited by quality of the video and audio technology used for the visit. [name] was able to get the patient set up on a video visit.  Patient location: Home Patient and provider in visit Provider location: Office  I discussed the limitations of evaluation and management by telemedicine and the availability of in person appointments. The patient expressed understanding and agreed to proceed.  Visit Date: 10/02/2021  Today's healthcare provider: Donato Schultz, DO     Subjective:    Patient ID: Veronica Yates, female    DOB: [birth year], 51 y.o.   MRN: [id number]  Chief Complaint  Patient presents with   ADD    Pt states wanting to get back on medication for ADD    HPI Patient is in today for a video visit.  She is requesting start taking adderall again. She reports her workload has increased and she is struggling to focus. She continues uses clonopin 1-2x daily.   Past Medical History:  Diagnosis Date   Depression    GERD (gastroesophageal reflux disease)     Past Surgical History:  Procedure Laterality Date   CESAREAN SECTION     dnc     laparascopy     TONSILLECTOMY     tubal reversal      Family History  Problem Relation Age of Onset   Depression Unknown    Bipolar disorder Sister    Diabetes Mother    Diabetes Father     Social History   Socioeconomic History   Marital status: Married    Spouse name: Not on file   Number of children: Not on file   Years of education: Not on file    Highest education level: Not on file  Occupational History   Occupation: [employer]  Tobacco Use   Smoking status: Never   Smokeless tobacco: Never  Substance and Sexual Activity   Alcohol use: No   Drug use: No   Sexual activity: Not on file  Other Topics Concern   Not on file  Social History Narrative   Not on file   Social Determinants of Health   Financial Resource Strain: Not on file  Food Insecurity: Not on file  Transportation Needs: Not on file  Physical Activity: Not on file  Stress: Not on file  Social Connections: Not on file  Intimate Partner Violence: Not on file    Outpatient Medications Prior to Visit  Medication Sig Dispense Refill   buPROPion (WELLBUTRIN XL) 150 MG 24 hr tablet TAKE TWO TABLETS BY MOUTH ONE TIME DAILY 180 tablet 3   clindamycin (CLEOCIN T) 1 % lotion Apply bid 60 mL 3   clonazePAM (KLONOPIN) 1 MG tablet TAKE ONE TABLET BY MOUTH TWICE A DAY AS NEEDED 60 tablet 1   sertraline (ZOLOFT) 100 MG tablet TAKE 2 TABLETS BY MOUTH EVERY DAY AS DIRECTED 180 tablet 3   No facility-administered medications prior to visit.    Allergies  Allergen Reactions   Penicillins     Unknown--reaction as an infant  Review of Systems  Constitutional:  Negative for fever and malaise/fatigue.  HENT:  Negative for congestion.   Eyes:  Negative for blurred vision.  Respiratory:  Negative for cough and shortness of breath.   Cardiovascular:  Negative for chest pain, palpitations and leg swelling.  Gastrointestinal:  Negative for vomiting.  Musculoskeletal:  Negative for back pain.  Skin:  Negative for rash.  Neurological:  Negative for loss of consciousness and headaches.  Psychiatric/Behavioral:  Negative for depression, hallucinations, memory loss, substance abuse and suicidal ideas. The patient is nervous/anxious. The patient does not have insomnia.       Objective:    Physical Exam Vitals and nursing note reviewed.  Psychiatric:         Attention and Perception: She is inattentive. She does not perceive auditory or visual hallucinations.        Mood and Affect: Mood normal.        Speech: Speech normal.        Behavior: Behavior normal. Behavior is cooperative.        Thought Content: Thought content normal.        Cognition and Memory: Cognition and memory normal.        Judgment: Judgment normal.    There were no vitals taken for this visit. Wt Readings from Last 3 Encounters:  12/10/20 184 lb 6.4 oz (83.6 kg)  10/08/20 195 lb (88.5 kg)  02/15/20 238 lb 2 oz (108 kg)    Diabetic Foot Exam - Simple   No data filed    Lab Results  Component Value Date   WBC 6.3 01/01/2017   HGB 12.8 01/01/2017   HCT 37.7 01/01/2017   PLT 328.0 01/01/2017   GLUCOSE 92 01/01/2017   CHOL 247 (H) 09/25/2016   TRIG 95 09/25/2016   HDL 67 09/25/2016   LDLCALC 161 (H) 09/25/2016   ALT 26 01/01/2017   AST 23 01/01/2017   NA 135 01/01/2017   K 3.9 01/01/2017   CL 103 01/01/2017   CREATININE 0.52 01/01/2017   BUN 19 01/01/2017   CO2 25 01/01/2017   TSH 1.30 01/01/2017    Lab Results  Component Value Date   TSH 1.30 01/01/2017   Lab Results  Component Value Date   WBC 6.3 01/01/2017   HGB 12.8 01/01/2017   HCT 37.7 01/01/2017   MCV 86.4 01/01/2017   PLT 328.0 01/01/2017   Lab Results  Component Value Date   NA 135 01/01/2017   K 3.9 01/01/2017   CO2 25 01/01/2017   GLUCOSE 92 01/01/2017   BUN 19 01/01/2017   CREATININE 0.52 01/01/2017   BILITOT 0.4 01/01/2017   ALKPHOS 62 01/01/2017   AST 23 01/01/2017   ALT 26 01/01/2017   PROT 7.5 01/01/2017   ALBUMIN 4.5 01/01/2017   CALCIUM 9.5 01/01/2017   GFR 134.02 01/01/2017   Lab Results  Component Value Date   CHOL 247 (H) 09/25/2016   Lab Results  Component Value Date   HDL 67 09/25/2016   Lab Results  Component Value Date   LDLCALC 161 (H) 09/25/2016   Lab Results  Component Value Date   TRIG 95 09/25/2016   Lab Results  Component Value Date    CHOLHDL 3.7 09/25/2016   No results found for: HGBA1C     Assessment & Plan:   Problem List Items Addressed This Visit       Unprioritized   ADD (attention deficit disorder) - Primary    Restart adderall  xr 20 mg and f/u in 1 month or sooner prn      Relevant Medications   amphetamine-dextroamphetamine (ADDERALL XR) 20 MG 24 hr capsule     Meds ordered this encounter  Medications   amphetamine-dextroamphetamine (ADDERALL XR) 20 MG 24 hr capsule    Sig: Take 1 capsule (20 mg total) by mouth every morning.    Dispense:  30 capsule    Refill:  0    I discussed the assessment and treatment plan with the patient. The patient was provided an opportunity to ask questions and all were answered. The patient agreed with the plan and demonstrated an understanding of the instructions.   The patient was advised to call back or seek an in-person evaluation if the symptoms worsen or if the condition fails to improve as anticipated.  I,Shehryar Baig,acting as a Neurosurgeon for [employer], DO.,have documented all relevant documentation on the behalf of Donato Schultz, DO,as directed by  Donato Schultz, DO while in the presence of Donato Schultz, DO.  I provided 20 minutes of face-to-face time during this encounter.   Donato Schultz, DO [facility] [phone] (phone) [phone] (fax)  [facility]

## 2021-10-02 NOTE — Assessment & Plan Note (Addendum)
Restart adderall xr 20 mg and f/u in 1 month or sooner prn Pt remembers possible side effects

## 2021-10-02 NOTE — Telephone Encounter (Signed)
PA initiated through [email or web address], Auth ID: [id number]. Awaiting determination.

## 2021-10-02 NOTE — Telephone Encounter (Signed)
-----   Message from Donato Schultz, DO sent at 10/02/2021 10:06 AM EST ----- Needs ov in 1 month

## 2021-10-02 NOTE — Telephone Encounter (Signed)
LVM for patient to schedule one month IN office

## 2021-10-10 NOTE — Telephone Encounter (Signed)
PA approved.   Drug/Service Name: DEXTROAMP-AMPHET ER 20 MG CAP Physician/Nurse: Loreen Freud CHASE EOC ID: [id number] Status: Approved Date Requested: 10/02/2021 13:41:43 Date Closed: 10/09/2021 17:29:16 Dispensing Location: [facility]

## 2021-12-29 ENCOUNTER — Encounter: Payer: Self-pay | Admitting: Family Medicine

## 2021-12-29 DIAGNOSIS — F411 Generalized anxiety disorder: Secondary | ICD-10-CM

## 2021-12-29 MED ORDER — CLONAZEPAM 1 MG PO TABS: 1.0000 mg | ORAL_TABLET | Freq: Two times a day (BID) | ORAL | 1 refills | Status: DC | PRN

## 2021-12-29 NOTE — Telephone Encounter (Signed)
Requesting: clonazepam 1mg   ?Contract: 12/10/2020 ?UDS: 12/10/2020 ?Last Visit: 10/02/21 ?Next Visit: 01/01/22 ?Last Refill: 06/23/21 #60 and 1RF ? ?Please Advise ? ?

## 2021-12-30 ENCOUNTER — Encounter: Payer: Self-pay | Admitting: *Deleted

## 2022-01-01 ENCOUNTER — Ambulatory Visit: Payer: 59 | Admitting: Family Medicine

## 2022-02-20 ENCOUNTER — Ambulatory Visit (INDEPENDENT_AMBULATORY_CARE_PROVIDER_SITE_OTHER): Payer: BLUE CROSS/BLUE SHIELD | Admitting: Family Medicine

## 2022-02-20 ENCOUNTER — Encounter: Payer: Self-pay | Admitting: Family Medicine

## 2022-02-20 VITALS — BP 100/60 | HR 64 | Temp 98.6°F | Resp 18 | Ht 62.0 in | Wt 168.2 lb

## 2022-02-20 DIAGNOSIS — F410 Panic disorder [episodic paroxysmal anxiety] without agoraphobia: Secondary | ICD-10-CM

## 2022-02-20 DIAGNOSIS — F32A Depression, unspecified: Secondary | ICD-10-CM

## 2022-02-20 DIAGNOSIS — F411 Generalized anxiety disorder: Secondary | ICD-10-CM

## 2022-02-20 DIAGNOSIS — F419 Anxiety disorder, unspecified: Secondary | ICD-10-CM

## 2022-02-20 DIAGNOSIS — Z79899 Other long term (current) drug therapy: Secondary | ICD-10-CM | POA: Diagnosis not present

## 2022-02-20 DIAGNOSIS — G4700 Insomnia, unspecified: Secondary | ICD-10-CM

## 2022-02-20 MED ORDER — BELSOMRA 10 MG PO TABS: 10.0000 mg | ORAL_TABLET | Freq: Every evening | ORAL | 1 refills | Status: AC | PRN

## 2022-02-20 MED ORDER — CLONAZEPAM 1 MG PO TABS: 1.0000 mg | ORAL_TABLET | Freq: Two times a day (BID) | ORAL | 1 refills | Status: AC | PRN

## 2022-02-20 MED ORDER — SERTRALINE HCL 100 MG PO TABS: ORAL_TABLET | ORAL | 3 refills | Status: AC

## 2022-02-20 NOTE — Assessment & Plan Note (Signed)
belsomra 10 mg qhs ---- if no results can increase to 2 po qhs  F/u prn

## 2022-02-20 NOTE — Patient Instructions (Signed)
Insomnia Insomnia is a sleep disorder that makes it difficult to fall asleep or stay asleep. Insomnia can cause fatigue, low energy, difficulty concentrating, mood swings, and poor performance at work or school. There are three different ways to classify insomnia: Difficulty falling asleep. Difficulty staying asleep. Waking up too early in the morning. Any type of insomnia can be long-term (chronic) or short-term (acute). Both are common. Short-term insomnia usually lasts for 3 months or less. Chronic insomnia occurs at least three times a week for longer than 3 months. What are the causes? Insomnia may be caused by another condition, situation, or substance, such as: Having certain mental health conditions, such as anxiety and depression. Using caffeine, alcohol, tobacco, or drugs. Having gastrointestinal conditions, such as gastroesophageal reflux disease (GERD). Having certain medical conditions. These include: Asthma. Alzheimer's disease. Stroke. Chronic pain. An overactive thyroid gland (hyperthyroidism). Other sleep disorders, such as restless legs syndrome and sleep apnea. Menopause. Sometimes, the cause of insomnia may not be known. What increases the risk? Risk factors for insomnia include: Gender. Females are affected more often than males. Age. Insomnia is more common as people get older. Stress and certain medical and mental health conditions. Lack of exercise. Having an irregular work schedule. This may include working night shifts and traveling between different time zones. What are the signs or symptoms? If you have insomnia, the main symptom is having trouble falling asleep or having trouble staying asleep. This may lead to other symptoms, such as: Feeling tired or having low energy. Feeling nervous about going to sleep. Not feeling rested in the morning. Having trouble concentrating. Feeling irritable, anxious, or depressed. How is this diagnosed? This condition  may be diagnosed based on: Your symptoms and medical history. Your health care provider may ask about: Your sleep habits. Any medical conditions you have. Your mental health. A physical exam. How is this treated? Treatment for insomnia depends on the cause. Treatment may focus on treating an underlying condition that is causing the insomnia. Treatment may also include: Medicines to help you sleep. Counseling or therapy. Lifestyle adjustments to help you sleep better. Follow these instructions at home: Eating and drinking  Limit or avoid alcohol, caffeinated beverages, and products that contain nicotine and tobacco, especially close to bedtime. These can disrupt your sleep. Do not eat a large meal or eat spicy foods right before bedtime. This can lead to digestive discomfort that can make it hard for you to sleep. Sleep habits  Keep a sleep diary to help you and your health care provider figure out what could be causing your insomnia. Write down: When you sleep. When you wake up during the night. How well you sleep and how rested you feel the next day. Any side effects of medicines you are taking. What you eat and drink. Make your bedroom a dark, comfortable place where it is easy to fall asleep. Put up shades or blackout curtains to block light from outside. Use a white noise machine to block noise. Keep the temperature cool. Limit screen use before bedtime. This includes: Not watching TV. Not using your smartphone, tablet, or computer. Stick to a routine that includes going to bed and waking up at the same times every day and night. This can help you fall asleep faster. Consider making a quiet activity, such as reading, part of your nighttime routine. Try to avoid taking naps during the day so that you sleep better at night. Get out of bed if you are still awake after   15 minutes of trying to sleep. Keep the lights down, but try reading or doing a quiet activity. When you feel  sleepy, go back to bed. General instructions Take over-the-counter and prescription medicines only as told by your health care provider. Exercise regularly as told by your health care provider. However, avoid exercising in the hours right before bedtime. Use relaxation techniques to manage stress. Ask your health care provider to suggest some techniques that may work well for you. These may include: Breathing exercises. Routines to release muscle tension. Visualizing peaceful scenes. Make sure that you drive carefully. Do not drive if you feel very sleepy. Keep all follow-up visits. This is important. Contact a health care provider if: You are tired throughout the day. You have trouble in your daily routine due to sleepiness. You continue to have sleep problems, or your sleep problems get worse. Get help right away if: You have thoughts about hurting yourself or someone else. Get help right away if you feel like you may hurt yourself or others, or have thoughts about taking your own life. Go to your nearest emergency room or: Call 911. Call the National Suicide Prevention Lifeline at 1-800-273-8255 or 988. This is open 24 hours a day. Text the Crisis Text Line at 741741. Summary Insomnia is a sleep disorder that makes it difficult to fall asleep or stay asleep. Insomnia can be long-term (chronic) or short-term (acute). Treatment for insomnia depends on the cause. Treatment may focus on treating an underlying condition that is causing the insomnia. Keep a sleep diary to help you and your health care provider figure out what could be causing your insomnia. This information is not intended to replace advice given to you by your health care provider. Make sure you discuss any questions you have with your health care provider. Document Revised: 08/18/2021 Document Reviewed: 08/18/2021 Elsevier Patient Education  2023 [employer]  

## 2022-02-20 NOTE — Progress Notes (Addendum)
Subjective:   By signing my name below, I, Veronica Yates, attest that this documentation has been prepared under the direction and in the presence of Veronica Yates, Veronica Yates, 02/20/2022    Patient ID: Veronica Yates, female    DOB: 11-Feb-1971, 51 y.o.   MRN: [id number]  Chief Complaint  Patient presents with   Anxiety   Follow-up    Anxiety Symptoms include insomnia and nervous/anxious behavior. Patient reports no chest pain, palpitations, shortness of breath or suicidal ideas.    Patient is in today for an office visit to f/u anxiety and she also c/o insomnia ---   Patient is requesting refills on zoloft and klonopin  Patient complains of severe insomnia during nights. She has tried OTC melatonin gummies but she experiences fogginess in the morning. She is concerned with using KLONPOIN at night since she takes it in the morning. She notes that she had taken Halcion. She denies drinking caffeine at night.  She reports her anxiety is under control while taking her 1 MG KLONOPIN medication bid.     Past Medical History:  Diagnosis Date   Depression    GERD (gastroesophageal reflux disease)     Past Surgical History:  Procedure Laterality Date   CESAREAN SECTION     dnc     laparascopy     TONSILLECTOMY     tubal reversal      Family History  Problem Relation Age of Onset   Depression Unknown    Bipolar disorder Sister    Diabetes Mother    Diabetes Father     Social History   Socioeconomic History   Marital status: Married    Spouse name: Not on file   Number of children: Not on file   Years of education: Not on file   Highest education level: Not on file  Occupational History   Occupation: [employer]  Tobacco Use   Smoking status: Never   Smokeless tobacco: Never  Substance and Sexual Activity   Alcohol use: No   Drug use: No   Sexual activity: Not on file  Other Topics Concern   Not on file  Social History Narrative   Not on file    Social Determinants of Health   Financial Resource Strain: Not on file  Food Insecurity: Not on file  Transportation Needs: Not on file  Physical Activity: Not on file  Stress: Not on file  Social Connections: Not on file  Intimate Partner Violence: Not on file    Outpatient Medications Prior to Visit  Medication Sig Dispense Refill   buPROPion (WELLBUTRIN XL) 150 MG 24 hr tablet TAKE TWO TABLETS BY MOUTH ONE TIME DAILY 180 tablet 3   clindamycin (CLEOCIN T) 1 % lotion Apply bid 60 mL 3   clonazePAM (KLONOPIN) 1 MG tablet Take 1 tablet (1 mg total) by mouth 2 (two) times daily as needed. 60 tablet 1   sertraline (ZOLOFT) 100 MG tablet TAKE 2 TABLETS BY MOUTH EVERY DAY AS DIRECTED 180 tablet 3   amphetamine-dextroamphetamine (ADDERALL XR) 20 MG 24 hr capsule Take 1 capsule (20 mg total) by mouth every morning. 30 capsule 0   No facility-administered medications prior to visit.    Allergies  Allergen Reactions   Penicillins     Unknown--reaction as an infant    Review of Systems  Constitutional:  Negative for fever and malaise/fatigue.  HENT:  Negative for congestion.   Eyes:  Negative for blurred vision.  Respiratory:  Negative  for cough and shortness of breath.   Cardiovascular:  Negative for chest pain, palpitations and leg swelling.  Gastrointestinal:  Negative for vomiting.  Musculoskeletal:  Negative for back pain.  Skin:  Negative for rash.  Neurological:  Negative for loss of consciousness and headaches.  Psychiatric/Behavioral:  Negative for depression, hallucinations, memory loss, substance abuse and suicidal ideas. The patient is nervous/anxious and has insomnia.       Objective:    Physical Exam Vitals and nursing note reviewed.  Constitutional:      General: She is not in acute distress.    Appearance: Normal appearance. She is not ill-appearing.  HENT:     Head: Normocephalic and atraumatic.     Right Ear: External ear normal.     Left Ear: External  ear normal.  Eyes:     Extraocular Movements: Extraocular movements intact.     Pupils: Pupils are equal, round, and reactive to light.  Cardiovascular:     Rate and Rhythm: Normal rate and regular rhythm.     Heart sounds: Normal heart sounds. No murmur heard.   No gallop.  Pulmonary:     Effort: Pulmonary effort is normal. No respiratory distress.     Breath sounds: Normal breath sounds. No wheezing or rales.  Skin:    General: Skin is warm and dry.  Neurological:     Mental Status: She is alert and oriented to person, place, and time.  Psychiatric:        Judgment: Judgment normal.    BP 100/60 (BP Location: Right Arm, Patient Position: Sitting, Cuff Size: Normal)   Pulse 64   Temp 98.6 F (37 C) (Oral)   Resp 18   Ht 5\' 2"  (1.575 m)   Wt 168 lb 3.2 oz (76.3 kg)   SpO2 98%   BMI 30.76 kg/m  Wt Readings from Last 3 Encounters:  02/20/22 168 lb 3.2 oz (76.3 kg)  12/10/20 184 lb 6.4 oz (83.6 kg)  10/08/20 195 lb (88.5 kg)    Diabetic Foot Exam - Simple   No data filed    Lab Results  Component Value Date   WBC 6.3 01/01/2017   HGB 12.8 01/01/2017   HCT 37.7 01/01/2017   PLT 328.0 01/01/2017   GLUCOSE 92 01/01/2017   CHOL 247 (H) 09/25/2016   TRIG 95 09/25/2016   HDL 67 09/25/2016   LDLCALC 161 (H) 09/25/2016   ALT 26 01/01/2017   AST 23 01/01/2017   NA 135 01/01/2017   K 3.9 01/01/2017   CL 103 01/01/2017   CREATININE 0.52 01/01/2017   BUN 19 01/01/2017   CO2 25 01/01/2017   TSH 1.30 01/01/2017    Lab Results  Component Value Date   TSH 1.30 01/01/2017   Lab Results  Component Value Date   WBC 6.3 01/01/2017   HGB 12.8 01/01/2017   HCT 37.7 01/01/2017   MCV 86.4 01/01/2017   PLT 328.0 01/01/2017   Lab Results  Component Value Date   NA 135 01/01/2017   K 3.9 01/01/2017   CO2 25 01/01/2017   GLUCOSE 92 01/01/2017   BUN 19 01/01/2017   CREATININE 0.52 01/01/2017   BILITOT 0.4 01/01/2017   ALKPHOS 62 01/01/2017   AST 23 01/01/2017   ALT  26 01/01/2017   PROT 7.5 01/01/2017   ALBUMIN 4.5 01/01/2017   CALCIUM 9.5 01/01/2017   GFR 134.02 01/01/2017   Lab Results  Component Value Date   CHOL 247 (H) 09/25/2016  Lab Results  Component Value Date   HDL 67 09/25/2016   Lab Results  Component Value Date   LDLCALC 161 (H) 09/25/2016   Lab Results  Component Value Date   TRIG 95 09/25/2016   Lab Results  Component Value Date   CHOLHDL 3.7 09/25/2016   No results found for: HGBA1C     Assessment & Plan:   Problem List Items Addressed This Visit       Unprioritized   PANIC ATTACK    Stable with klonopin and zoloft        Relevant Medications   sertraline (ZOLOFT) 100 MG tablet   Insomnia    belsomra 10 mg qhs ---- if no results can increase to 2 po qhs  F/u prn        Relevant Medications   Suvorexant (BELSOMRA) 10 MG TABS   Other Visit Diagnoses     High risk medication use    -  Primary   Relevant Orders   Drug Monitoring Panel [phone] , Urine   Anxiety and depression       Relevant Medications   sertraline (ZOLOFT) 100 MG tablet   Generalized anxiety disorder       Relevant Medications   sertraline (ZOLOFT) 100 MG tablet   clonazePAM (KLONOPIN) 1 MG tablet       Meds ordered this encounter  Medications   sertraline (ZOLOFT) 100 MG tablet    Sig: TAKE 2 TABLETS BY MOUTH EVERY DAY AS DIRECTED    Dispense:  180 tablet    Refill:  3   clonazePAM (KLONOPIN) 1 MG tablet    Sig: Take 1 tablet (1 mg total) by mouth 2 (two) times daily as needed.    Dispense:  60 tablet    Refill:  1   Suvorexant (BELSOMRA) 10 MG TABS    Sig: Take 10 mg by mouth at bedtime as needed.    Dispense:  30 tablet    Refill:  1    I, Donato Schultz, Yates, personally preformed the services described in this documentation.  All medical record entries made by the scribe were at my direction and in my presence.  I have reviewed the chart and discharge instructions (if applicable) and agree that the record  reflects my personal performance and is accurate and complete. 02/20/2022   I,Amber Yates,acting as a scribe for Donato Schultz, Yates.,have documented all relevant documentation on the behalf of Donato Schultz, Yates,as directed by  Donato Schultz, Yates while in the presence of Donato Schultz, Yates.     Donato Schultz, Yates

## 2022-02-20 NOTE — Assessment & Plan Note (Signed)
Stable with klonopin and zoloft

## 2022-02-24 ENCOUNTER — Telehealth: Payer: Self-pay | Admitting: *Deleted

## 2022-02-24 NOTE — Telephone Encounter (Signed)
Prior auth started via cover my meds.  Awaiting determination.  Key: [id number]

## 2022-02-27 LAB — DRUG MONITORING PANEL 376104, URINE
Alphahydroxymidazolam: NEGATIVE ng/mL (ref <50)
Alphahydroxytriazolam: 116 ng/mL — ABNORMAL HIGH (ref <50)
Aminoclonazepam: NEGATIVE ng/mL (ref <25)
Amphetamines: NEGATIVE ng/mL (ref <500)
Barbiturates: NEGATIVE ng/mL (ref <300)
Benzodiazepines: POSITIVE ng/mL — AB (ref <100)
Cocaine Metabolite: NEGATIVE ng/mL (ref <150)
Codeine: NEGATIVE ng/mL (ref <50)
Desmethyltramadol: NEGATIVE ng/mL (ref <100)
Hydrocodone: NEGATIVE ng/mL (ref <50)
Hydromorphone: NEGATIVE ng/mL (ref <50)
Hydroxyethylflurazepam: NEGATIVE ng/mL (ref <50)
Lorazepam: NEGATIVE ng/mL (ref <50)
Morphine: 168 ng/mL — ABNORMAL HIGH (ref <50)
Nordiazepam: NEGATIVE ng/mL (ref <50)
Norhydrocodone: NEGATIVE ng/mL (ref <50)
Opiates: POSITIVE ng/mL — AB (ref <100)
Oxazepam: NEGATIVE ng/mL (ref <50)
Oxycodone: NEGATIVE ng/mL (ref <100)
Temazepam: NEGATIVE ng/mL (ref <50)
Tramadol: NEGATIVE ng/mL (ref <100)

## 2022-02-27 LAB — DM TEMPLATE

## 2022-03-02 NOTE — Telephone Encounter (Signed)
Approvedon June 8 The case has been Approved from 02/26/22 to 02/27/23
